# Patient Record
Sex: Female | Born: 1960 | Race: White | Hispanic: No | Marital: Married | Smoking: Former smoker
Health system: Southern US, Community
[De-identification: ages and names within clinical notes are randomized; demographics above are authoritative.]

## PROBLEM LIST (undated history)

## (undated) DIAGNOSIS — I219 Acute myocardial infarction, unspecified: Secondary | ICD-10-CM

## (undated) DIAGNOSIS — E119 Type 2 diabetes mellitus without complications: Secondary | ICD-10-CM

## (undated) DIAGNOSIS — K219 Gastro-esophageal reflux disease without esophagitis: Secondary | ICD-10-CM

## (undated) DIAGNOSIS — B029 Zoster without complications: Secondary | ICD-10-CM

## (undated) HISTORY — PX: ABDOMINAL HYSTERECTOMY: SHX81

## (undated) HISTORY — PX: OTHER SURGICAL HISTORY: SHX169

## (undated) HISTORY — PX: TUBAL LIGATION: SHX77

---
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
## 2014-03-24 DIAGNOSIS — E119 Type 2 diabetes mellitus without complications: Secondary | ICD-10-CM | POA: Insufficient documentation

## 2014-03-24 DIAGNOSIS — I10 Essential (primary) hypertension: Secondary | ICD-10-CM | POA: Insufficient documentation

## 2014-03-28 DIAGNOSIS — I2510 Atherosclerotic heart disease of native coronary artery without angina pectoris: Secondary | ICD-10-CM | POA: Insufficient documentation

## 2014-03-28 DIAGNOSIS — Z955 Presence of coronary angioplasty implant and graft: Secondary | ICD-10-CM | POA: Insufficient documentation

## 2014-06-10 DIAGNOSIS — I252 Old myocardial infarction: Secondary | ICD-10-CM | POA: Insufficient documentation

## 2014-06-10 DIAGNOSIS — K219 Gastro-esophageal reflux disease without esophagitis: Secondary | ICD-10-CM | POA: Insufficient documentation

## 2015-07-14 DIAGNOSIS — M720 Palmar fascial fibromatosis [Dupuytren]: Secondary | ICD-10-CM | POA: Insufficient documentation

## 2016-10-24 DIAGNOSIS — IMO0002 Reserved for concepts with insufficient information to code with codable children: Secondary | ICD-10-CM | POA: Insufficient documentation

## 2016-10-24 DIAGNOSIS — M720 Palmar fascial fibromatosis [Dupuytren]: Secondary | ICD-10-CM | POA: Insufficient documentation

## 2016-10-24 DIAGNOSIS — G5603 Carpal tunnel syndrome, bilateral upper limbs: Secondary | ICD-10-CM | POA: Insufficient documentation

## 2019-03-10 ENCOUNTER — Ambulatory Visit
Admission: EM | Admit: 2019-03-10 | Discharge: 2019-03-10 | Disposition: A | Discharge status: Home / Self Care | Payer: BC Managed Care – PPO | Attending: Family Medicine | Admitting: Family Medicine

## 2019-03-10 ENCOUNTER — Other Ambulatory Visit: Payer: Self-pay

## 2019-03-10 DIAGNOSIS — B029 Zoster without complications: Secondary | ICD-10-CM

## 2019-03-10 HISTORY — DX: Acute myocardial infarction, unspecified: I21.9

## 2019-03-10 HISTORY — DX: Type 2 diabetes mellitus without complications: E11.9

## 2019-03-10 HISTORY — DX: Gastro-esophageal reflux disease without esophagitis: K21.9

## 2019-03-10 HISTORY — DX: Zoster without complications: B02.9

## 2019-03-10 MED ORDER — NAPROXEN 500 MG PO TABS: 500.0000 mg | ORAL_TABLET | Freq: Two times a day (BID) | ORAL | 0 refills | Status: AC | PRN

## 2019-03-10 MED ORDER — VALACYCLOVIR HCL 1 G PO TABS: 1000.0000 mg | ORAL_TABLET | Freq: Three times a day (TID) | ORAL | 0 refills | Status: DC

## 2019-03-10 NOTE — ED Triage Notes (Signed)
Pt with shingles-type rash to upper right back started on Tuesday. Pain 3/10

## 2019-03-10 NOTE — ED Provider Notes (Signed)
[facility]    CSN: [id number] Arrival date & time: 03/10/19  1242  History   Chief Complaint Chief Complaint  Patient presents with  . Rash   HPI  58 year old female presents with rash.  Patient reports that her rash started on Tuesday.  Is located on her right upper back above the shoulder blade.  She reports preceding pain which she describes as tingling and burning.  Pain is moderate to severe currently.  She states that she is been under a great deal of stress recently.  No fever.  No known relieving factors. No other associated symptoms. No other complaints.  PMH, Surgical Hx, Family Hx, Social History reviewed and updated as below.  Past Medical History:  Diagnosis Date  . Acid reflux   . Diabetes ([place])   . MI (myocardial infarction) ([place])   Insomnia CAD  Past Surgical History:  Procedure Laterality Date  . ABDOMINAL HYSTERECTOMY    . cardiac stents    . TUBAL LIGATION     OB History   No obstetric history on file.    Home Medications    Prior to Admission medications   Medication Sig Start Date End Date Taking? Authorizing Provider  aspirin EC 81 MG tablet Take 81 mg by mouth daily.   Yes [provider]  insulin glargine (LANTUS) 100 UNIT/ML injection Inject into the skin daily.   Yes [provider]  metFORMIN (GLUCOPHAGE) 1000 MG tablet Take 1,000 mg by mouth 2 (two) times daily with a meal.   Yes [provider]  amitriptyline (ELAVIL) 25 MG tablet TAKE 1 TABLET BY MOUTH EVERY DAY EVERY NIGHT 01/28/19   [provider]  carvedilol (COREG) 6.25 MG tablet  02/06/19   [provider]  clindamycin (CLEOCIN T) 1 % external solution APPLY TO AFFECTED AREA TWICE A DAY 02/01/19   [provider]  naproxen (NAPROSYN) 500 MG tablet Take 1 tablet (500 mg total) by mouth 2 (two) times daily as needed for moderate pain. 03/10/19   Coral Spikes, DO  pantoprazole (PROTONIX) 40 MG tablet Take 40 mg by mouth  daily. 01/11/19   [provider]  traZODone (DESYREL) 150 MG tablet TAKE 1 TABLET (150 MG TOTAL) BY MOUTH NIGHTLY AS NEEDED FOR SLEEP. 02/07/19   [provider]  valACYclovir (VALTREX) 1000 MG tablet Take 1 tablet (1,000 mg total) by mouth 3 (three) times daily. 03/10/19   Coral Spikes, DO   Social History Social History   Tobacco Use  . Smoking status: Former Research scientist (life sciences)  . Smokeless tobacco: Never Used  Substance Use Topics  . Alcohol use: Yes    Comment: social  . Drug use: Never   Allergies   Patient has no known allergies.   Review of Systems Review of Systems  Constitutional: Negative.   Skin: Positive for rash.   Physical Exam Triage Vital Signs ED Triage Vitals  Enc Vitals Group     BP 03/10/19 1253 (!) 159/90     Pulse Rate 03/10/19 1253 92     Resp 03/10/19 1253 18     Temp 03/10/19 1253 98.3 F (36.8 C)     Temp Source 03/10/19 1253 Oral     SpO2 03/10/19 1253 99 %     Weight 03/10/19 1255 180 lb (81.6 kg)     Height 03/10/19 1255 5\' 5"  (1.651 m)     Head Circumference --      Peak Flow --  Pain Score 03/10/19 1255 3     Pain Loc --      Pain Edu? --      Excl. in GC? --    Updated Vital Signs BP (!) 159/90 (BP Location: Left Arm)   Pulse 92   Temp 98.3 F (36.8 C) (Oral)   Resp 18   Ht 5\' 5"  (1.651 m)   Wt 81.6 kg   SpO2 99%   BMI 29.95 kg/m   Visual Acuity Right Eye Distance:   Left Eye Distance:   Bilateral Distance:    Right Eye Near:   Left Eye Near:    Bilateral Near:     Physical Exam Vitals signs and nursing note reviewed.  Constitutional:      General: She is not in acute distress.    Appearance: Normal appearance.  HENT:     Head: Normocephalic.  Eyes:     General:        Right eye: No discharge.        Left eye: No discharge.     Conjunctiva/sclera: Conjunctivae normal.  Cardiovascular:     Rate and Rhythm: Normal rate and regular rhythm.  Pulmonary:     Effort: Pulmonary effort is normal.      Breath sounds: Normal breath sounds.  Skin:         Comments: Raised, vesicular rash noted at the labelled location.  Patient does have a few pustules.  Neurological:     Mental Status: She is alert.  Psychiatric:        Mood and Affect: Mood normal.        Behavior: Behavior normal.    UC Treatments / Results  Labs (all labs ordered are listed, but only abnormal results are displayed) Labs Reviewed - No data to display  EKG   Radiology No results found.  Procedures Procedures (including critical care time)  Medications Ordered in UC Medications - No data to display  Initial Impression / Assessment and Plan / UC Course  I have reviewed the triage vital signs and the nursing notes.  Pertinent labs & imaging results that were available during my care of the patient were reviewed by me and considered in my medical decision making (see chart for details).    58 year old female presents with suspected herpes zoster.  Treated with Valtrex.  Naproxen as needed for pain.  Supportive care.  Final Clinical Impressions(s) / UC Diagnoses   Final diagnoses:  Herpes zoster without complication   Discharge Instructions   None    ED Prescriptions    Medication Sig Dispense Auth. Provider   valACYclovir (VALTREX) 1000 MG tablet Take 1 tablet (1,000 mg total) by mouth 3 (three) times daily. 21 tablet Ermon Sagan G, DO   naproxen (NAPROSYN) 500 MG tablet Take 1 tablet (500 mg total) by mouth 2 (two) times daily as needed for moderate pain. 30 tablet Tommie Samsook, Earlyne Feeser G, DO     Controlled Substance Prescriptions [place] Controlled Substance Registry consulted? Not Applicable   Tommie SamsCook, Espyn Radwan G, DO 03/10/19 1511

## 2019-03-19 ENCOUNTER — Ambulatory Visit
Admission: EM | Admit: 2019-03-19 | Discharge: 2019-03-19 | Disposition: A | Discharge status: Home / Self Care | Payer: BC Managed Care – PPO | Attending: Urgent Care | Admitting: Urgent Care

## 2019-03-19 ENCOUNTER — Encounter: Payer: Self-pay | Admitting: Emergency Medicine

## 2019-03-19 ENCOUNTER — Other Ambulatory Visit: Payer: Self-pay

## 2019-03-19 DIAGNOSIS — L03312 Cellulitis of back [any part except buttock]: Secondary | ICD-10-CM | POA: Diagnosis not present

## 2019-03-19 DIAGNOSIS — B028 Zoster with other complications: Secondary | ICD-10-CM

## 2019-03-19 MED ORDER — GABAPENTIN 100 MG PO CAPS: 100.0000 mg | ORAL_CAPSULE | Freq: Every evening | ORAL | 0 refills | Status: DC | PRN

## 2019-03-19 MED ORDER — SULFAMETHOXAZOLE-TRIMETHOPRIM 800-160 MG PO TABS: 1.0000 | ORAL_TABLET | Freq: Two times a day (BID) | ORAL | 0 refills | Status: AC

## 2019-03-19 MED ORDER — HYDROCODONE-ACETAMINOPHEN 5-325 MG PO TABS: 1.0000 | ORAL_TABLET | Freq: Two times a day (BID) | ORAL | 0 refills | Status: DC | PRN

## 2019-03-19 MED ORDER — VALACYCLOVIR HCL 1 G PO TABS: 1000.0000 mg | ORAL_TABLET | Freq: Three times a day (TID) | ORAL | 0 refills | Status: AC

## 2019-03-19 NOTE — ED Triage Notes (Signed)
Was seen here on 7/19 and dx with shingles, given valtrex and naproxen. Pt reports rash has gotten worse, more oozing and pain since then. Pt reports she is diabetic.

## 2019-03-19 NOTE — Discharge Instructions (Signed)
It was very nice seeing you today in clinic. Thank you for entrusting me with your care.   Please utilize the medications that we discussed. Your prescriptions have been called in to your pharmacy. Do not drive when using the opioids.   Keep areas clean and dry. Leave open to air while at home to promote drying.   Make arrangements to follow up with your regular doctor in 3 days for re-evaluation if not improving. If your symptoms/condition worsens, please seek follow up care either here or in the ER. Please remember, our [facility] providers are "right here with you" when you need [place].   Again, it was my pleasure to take care of you today. Thank you for choosing our clinic. I hope that you start to feel better quickly.   Honor Loh, MSN, APRN, FNP-C, CEN Advanced Practice Provider [facility]

## 2019-03-19 NOTE — ED Provider Notes (Signed)
[place], [place]   Name: Nicole Singh DOB: 04/07/1961 MRN: [id number] CSN: [id number] PCP: System, Pcp Not In  Arrival date and time:  03/19/19 1108  Chief Complaint:  Rash   NOTE: Prior to seeing the patient today, I have reviewed the triage nursing documentation and vital signs. Clinical staff has updated patient's PMH/PSHx, current medication list, and drug allergies/intolerances to ensure comprehensive history available to assist in medical decision making.   History:   HPI: Nicole Singh is a 58 y.o. female who presents today with complaints of pain to the RIGHT side of her upper back (superior to scapula). Pain is described as a constant and intense burning. She was seen here on 03/10/2019 for the same. She was diagnosed with shingles and prescribed a 7 day course of valacyclovir and naprosyn for pain. Patient presents today having completed her antiviral therapy on 03/17/2019. Over the course of the last few days, patient notes that area has been draining copious amounts of sanguinopurulent exudate. She has gone back to work at this point. Due to the drainage, patient is having to apply cover dressings to prevent the drainage from seeping through her clothing. Patient reports that she is super saturating at least 3 bulky dressings every day. She has not experienced any associated fevers.Her pain has intensified despite the naprosyn. Her sleep quality has been poor overall due to her pain. Patient presents to clinic today in tears; self rates pain 7/10.   Past Medical History:  Diagnosis Date  . Acid reflux   . Diabetes ([facility])   . MI (myocardial infarction) ([facility])   . Shingles 03/10/2019    Past Surgical History:  Procedure Laterality Date  . ABDOMINAL HYSTERECTOMY    . cardiac stents    . TUBAL LIGATION      History reviewed. No pertinent family history.  Social History   Tobacco Use  . Smoking status: Former Games developermoker  . Smokeless tobacco: Never Used  Substance Use Topics   . Alcohol use: Yes    Comment: social  . Drug use: Never    There are no active problems to display for this patient.   Home Medications:    No outpatient medications have been marked as taking for the 03/19/19 encounter [facility](Hospital Encounter).    Allergies:   Patient has no known allergies.  Review of Systems (ROS): Review of Systems  Constitutional: Negative for chills and fever.  Respiratory: Negative for cough and shortness of breath.   Cardiovascular: Negative for chest pain and palpitations.  Gastrointestinal: Negative for diarrhea, nausea and vomiting.  Musculoskeletal: Negative for arthralgias, back pain, myalgias and neck pain.  Skin: Positive for color change, rash and wound.  Neurological: Negative for dizziness, seizures, syncope, weakness, numbness and headaches.  Hematological: Negative for adenopathy.  Psychiatric/Behavioral: Positive for sleep disturbance. The patient is nervous/anxious.   All other systems reviewed and are negative.    Vital Signs: Today's Vitals   03/19/19 1121 03/19/19 1122  BP:  131/76  Pulse: (!) 108   Resp: 20   Temp: 98.9 F (37.2 C)   TempSrc: Oral   SpO2: 98%   Weight:  178 lb (80.7 kg)  Height:  5\' 5"  (1.651 m)  PainSc:  7     Physical Exam: Constitutional: She is oriented to person, place, and time and well-developed, well-nourished, and in no distress.  HENT:  Head: Normocephalic and atraumatic.  Mouth/Throat: Mucous membranes are normal.  Eyes: Pupils are equal, round, and reactive to light. EOM are  normal.  Neck: Normal range of motion. Neck supple. No tracheal deviation present.  Cardiovascular: Normal rate, regular rhythm, normal heart sounds and intact distal pulses. Exam reveals no gallop and no friction rub.  No murmur heard. Pulmonary/Chest: Effort normal and breath sounds normal. No respiratory distress. She has no wheezes. She has no rales.  Lymphadenopathy:    She has no cervical adenopathy.  Neurological:  She is alert and oriented to person, place, and time. Gait normal. GCS score is 15.  Skin: Skin is warm and dry. Rash noted. Rash is vesicular.     Psychiatric: Memory, affect and judgment normal. Her mood appears anxious (tearful).  Nursing note and vitals reviewed.  Urgent Care Treatments / Results:   LABS: PLEASE NOTE: all labs that were ordered this encounter are listed, however only abnormal results are displayed. Labs Reviewed - No data to display  EKG: -None  RADIOLOGY: No results found.  PROCEDURES: Procedures  MEDICATIONS RECEIVED THIS VISIT: Medications - No data to display  PERTINENT CLINICAL COURSE NOTES/UPDATES:   Initial Impression / Assessment and Plan / Urgent Care Course:  Pertinent labs & imaging results that were available during my care of the patient were personally reviewed by me and considered in my medical decision making (see lab/imaging section of note for values and interpretations).  Nicole Singh is a 58 y.o. female who [facility] with complaints of worsening shingles and associated skin infection.   Patient is well appearing overall in clinic today. She does not appear to be in any acute distress. Presenting symptoms (see HPI) and exam as documented above. Area of concern consistent with persistent shingles infection with bacterial supra-infection. Draining rash overlying tender areas of induration that is warm and exquisitely tender to touch. Discussed that normal skin flora/bacteria was likely introduced into the skin when the rash initially declared and was pruritic. Will repeat valacyclovir course and add in a 7 day course of SMZ-TMP DS for supra-infection (cellulitis). Pain is severe. Will add gabapentin 100 mg qhs PRN; will likely require dose titration. Additionally, I wanted to use Tramadol during the day for severe pain, however she is on amitriptyline and trazodone, both of which are associated with serotonin syndrome  when used in combination with tramadol. Will provide a short course of Norco 5/325 mg for severe pain not relieved by the other interventions in place. She was educated on the side effects of these medications and encouraged not to take them all together; stagger doses.   We discussed wound care in clinic today.  At present, patient's husband is performed care. He is taping over active/healing lesions, which effectively opens the areas when tape is removed. I had clinic RN demonstrate proper care to patient. We discussed leaving open to air as much as possible while at home to allow wound to dry out and promote crusting/healing. Patient encouraged to keep area as clean and dry. She was reminded that while areas are open and draining, she is considered contagious.   Current clinical condition warrants patient being out of work in order to recover from her current injury/illness. She was provided with the appropriate documentation to provide to her place of employment that will allow for her to RTW on 03/22/2019 with no restrictions.   Discussed follow up with primary care physician in 3 days for re-evaluation if not starting to see some improvement. I have reviewed the follow up and strict return precautions for any new or worsening symptoms. Patient is  aware of symptoms that would be deemed urgent/emergent, and would thus require further evaluation either here or in the emergency department. At the time of discharge, she verbalized understanding and consent with the discharge plan as it was reviewed with her. All questions were fielded by provider and/or clinic staff prior to patient discharge.    Final Clinical Impressions / Urgent Care Diagnoses:   Final diagnoses:  Herpes zoster with other complication  Cellulitis of back except buttock    New Prescriptions:  [place] Controlled Substance Registry consulted? Yes, I have consulted the [place] Controlled Substances Registry for this patient, and feel the  risk/benefit ratio today is favorable for proceeding with this prescription for a controlled substance.  Meds ordered this encounter  Medications  . valACYclovir (VALTREX) 1000 MG tablet    Sig: Take 1 tablet (1,000 mg total) by mouth 3 (three) times daily.    Dispense:  21 tablet    Refill:  0  . sulfamethoxazole-trimethoprim (BACTRIM DS) 800-160 MG tablet    Sig: Take 1 tablet by mouth 2 (two) times daily for 7 days.    Dispense:  14 tablet    Refill:  0  . gabapentin (NEURONTIN) 100 MG capsule    Sig: Take 1 capsule (100 mg total) by mouth at bedtime as needed.    Dispense:  30 capsule    Refill:  0  . HYDROcodone-acetaminophen (NORCO/VICODIN) 5-325 MG tablet    Sig: Take 1 tablet by mouth 2 (two) times daily as needed.    Dispense:  10 tablet    Refill:  0   . Discussed use of controlled substance medication to treat her acute pain.  o Reviewed [place] STOP Act regulations  o Clinic does not refill controlled substances over the phone without face to face evaluation.  . Safety precautions reviewed.  o Medications should not be bitten, chewed, crushed, shared, or taken with alcohol.  o Avoid use while working, driving, or operating heavy machinery.  o Side effects associated with the use of this particular medication reviewed. - Patient understands that this medication can cause CNS depression, increase her risk of falls, and even lead to overdose that may result in death, if used outside of the parameters that she and I discussed.  With all of this in mind, she knowingly accepts the risks and responsibilities associated with intended course of treatment, and elects to responsibly proceed as discussed.  Recommended Follow up Care:  Patient encouraged to follow up with the following provider within the specified time frame, or sooner as dictated by the severity of her symptoms. As always, she was instructed that for any urgent/emergent care needs, she should seek care either here or in  the emergency department for more immediate evaluation.  Follow-up Information    PCP In 3 days.         NOTE: This note was prepared using Lobbyist along with smaller Company secretary. Despite my best ability to proofread, there is the potential that transcriptional errors may still occur from this process, and are completely unintentional.      Karen Kitchens, NP 03/19/19 2112

## 2019-03-21 ENCOUNTER — Telehealth: Payer: Self-pay | Admitting: Emergency Medicine

## 2019-03-21 NOTE — Telephone Encounter (Signed)
Patient called stating the Gabapentin was not working for her pain. She is requesting another script for Hydrocodone. Per directions patient should still have this medication. Spoke with Honor Loh, NP and he stated to have patient increase Gabapentin to 2 capsules at bedtime and that she needed to follow up with her PCP for further pain medication. Patient verbalized understanding.

## 2019-07-31 ENCOUNTER — Other Ambulatory Visit: Payer: Self-pay | Admitting: Obstetrics and Gynecology

## 2019-07-31 DIAGNOSIS — Z1231 Encounter for screening mammogram for malignant neoplasm of breast: Secondary | ICD-10-CM

## 2019-08-21 DIAGNOSIS — U071 COVID-19: Secondary | ICD-10-CM

## 2019-08-21 HISTORY — DX: COVID-19: U07.1

## 2019-08-26 ENCOUNTER — Other Ambulatory Visit: Payer: Self-pay

## 2019-08-26 ENCOUNTER — Ambulatory Visit (INDEPENDENT_AMBULATORY_CARE_PROVIDER_SITE_OTHER): Payer: BC Managed Care – PPO

## 2019-08-26 ENCOUNTER — Ambulatory Visit
Admission: EM | Admit: 2019-08-26 | Discharge: 2019-08-26 | Disposition: A | Discharge status: Home / Self Care | Payer: BC Managed Care – PPO | Attending: Nurse Practitioner | Admitting: Nurse Practitioner

## 2019-08-26 DIAGNOSIS — R05 Cough: Secondary | ICD-10-CM | POA: Diagnosis not present

## 2019-08-26 DIAGNOSIS — R059 Cough, unspecified: Secondary | ICD-10-CM

## 2019-08-26 DIAGNOSIS — U071 COVID-19: Secondary | ICD-10-CM

## 2019-08-26 MED ORDER — METHYLPREDNISOLONE SODIUM SUCC 40 MG IJ SOLR
80.0000 mg | Freq: Once | INTRAMUSCULAR | Status: AC
  Administered 2019-08-26: 17:00:00 80 mg via INTRAMUSCULAR

## 2019-08-26 MED ORDER — AZITHROMYCIN 250 MG PO TABS: 250.0000 mg | ORAL_TABLET | Freq: Every day | ORAL | 0 refills | Status: DC

## 2019-08-26 MED ORDER — DM-GUAIFENESIN ER 30-600 MG PO TB12: 1.0000 | ORAL_TABLET | Freq: Two times a day (BID) | ORAL | 0 refills | Status: AC

## 2019-08-26 MED ORDER — ALBUTEROL SULFATE HFA 108 (90 BASE) MCG/ACT IN AERS: 1.0000 | INHALATION_SPRAY | Freq: Four times a day (QID) | RESPIRATORY_TRACT | 0 refills | Status: AC | PRN

## 2019-08-26 MED ORDER — PROMETHAZINE-DM 6.25-15 MG/5ML PO SYRP: 5.0000 mL | ORAL_SOLUTION | Freq: Four times a day (QID) | ORAL | 0 refills | Status: DC | PRN

## 2019-08-26 NOTE — ED Triage Notes (Addendum)
Pt. States the [facility] Informed her to come here for a chest Xray to make sure she does NOT have puemonia and COVID. She tested POSITIVE on 08/21/2019, states it hurts when coughing.

## 2019-08-26 NOTE — Discharge Instructions (Signed)
Take medications as prescribed. You may take tylenol or ibuprofen as needed for fevers/headache/body aches. Drink plenty of fluids. Stay in home isolation until you have met all 3 criteria needed to discontinue isolation. Please follow CDC guidelines that are attached. You may discontinue home isolation when there has been at least 10 days since symptoms onset AND 3 days fever free without antipyretics (Tylenol or Ibuprofen) AND an overall improvement in your symptoms. Go to the ED immediately if you get worse or have any other symptoms.   Feel better soon!  Aldona Bar, FNP-C

## 2019-08-26 NOTE — ED Provider Notes (Signed)
[facility]    CSN: [id number] Arrival date & time: 08/26/19  1538      History   Chief Complaint Chief Complaint  Patient presents with   Chest XRay    HPI Nikoleta Dady is a 59 y.o. female.   Subjective:   Sundi Slevin is a 59 y.o. female here for evaluation of a cough. The cough is harsh, barking and worsening over time. It is aggravated by nothing. Onset of symptoms was several days ago and gradually worsening since that time. Notably, the patient was diagnosed with COVID-19 on 08/21/19. She has had fevers, chills, body aches, sore throat, shortness of breath, nausea, diarrhea, headache, dizziness and change in taste/smell.  Denies any vomiting. Patient does not have a history of asthma. Patient has not had recent travel. Patient does not have a history of smoking.  The following portions of the patient's history were reviewed and updated as appropriate: allergies, current medications, past family history, past medical history, past social history, past surgical history and problem list.       Past Medical History:  Diagnosis Date   Acid reflux    Diabetes ([place])    MI (myocardial infarction) ([place])    Shingles 03/10/2019    There are no problems to display for this patient.   Past Surgical History:  Procedure Laterality Date   ABDOMINAL HYSTERECTOMY     cardiac stents     TUBAL LIGATION      OB History   No obstetric history on file.      Home Medications    Prior to Admission medications   Medication Sig Start Date End Date Taking? Authorizing Provider  albuterol (VENTOLIN HFA) 108 (90 Base) MCG/ACT inhaler Inhale 1-2 puffs into the lungs every 6 (six) hours as needed for wheezing or shortness of breath. 08/26/19   Enrique Sack, FNP  amitriptyline (ELAVIL) 25 MG tablet TAKE 1 TABLET BY MOUTH EVERY DAY EVERY NIGHT 01/28/19   [provider]  aspirin EC 81 MG tablet Take 81 mg by mouth daily.    [provider]    azithromycin (ZITHROMAX) 250 MG tablet Take 1 tablet (250 mg total) by mouth daily. Take first 2 tablets together, then 1 every day until finished. 08/26/19   Enrique Sack, FNP  carvedilol (COREG) 6.25 MG tablet  02/06/19   [provider]  clindamycin (CLEOCIN T) 1 % external solution APPLY TO AFFECTED AREA TWICE A DAY 02/01/19   [provider]  dextromethorphan-guaiFENesin (MUCINEX DM) 30-600 MG 12hr tablet Take 1 tablet by mouth 2 (two) times daily for 7 days. 08/26/19 09/02/19  Enrique Sack, FNP  gabapentin (NEURONTIN) 100 MG capsule Take 1 capsule (100 mg total) by mouth at bedtime as needed. 03/19/19   Karen Kitchens, NP  HYDROcodone-acetaminophen (NORCO/VICODIN) 5-325 MG tablet Take 1 tablet by mouth 2 (two) times daily as needed. 03/19/19   Karen Kitchens, NP  insulin glargine (LANTUS) 100 UNIT/ML injection Inject into the skin daily.    [provider]  metFORMIN (GLUCOPHAGE) 1000 MG tablet Take 1,000 mg by mouth 2 (two) times daily with a meal.    [provider]  naproxen (NAPROSYN) 500 MG tablet Take 1 tablet (500 mg total) by mouth 2 (two) times daily as needed for moderate pain. 03/10/19   Coral Spikes, DO  pantoprazole (PROTONIX) 40 MG tablet Take 40 mg by mouth daily. 01/11/19   [provider]  promethazine-dextromethorphan (PROMETHAZINE-DM) 6.25-15 MG/5ML syrup Take 5  mLs by mouth 4 (four) times daily as needed for cough. 08/26/19   Enrique Sack, FNP  traZODone (DESYREL) 150 MG tablet TAKE 1 TABLET (150 MG TOTAL) BY MOUTH NIGHTLY AS NEEDED FOR SLEEP. 02/07/19   [provider]  valACYclovir (VALTREX) 1000 MG tablet Take 1 tablet (1,000 mg total) by mouth 3 (three) times daily. 03/19/19   Karen Kitchens, NP    Family History Family History  Problem Relation Age of Onset   Emphysema Mother    Diabetes Father     Social History Social History   Tobacco Use   Smoking status: Former Smoker   Smokeless tobacco: Never  Used  Substance Use Topics   Alcohol use: Yes    Comment: social   Drug use: Never     Allergies   Patient has no known allergies.   Review of Systems Review of Systems  Constitutional: Positive for chills, diaphoresis, fatigue and fever.  HENT: Positive for congestion and sore throat.   Respiratory: Positive for cough, shortness of breath and wheezing.   Cardiovascular: Negative for chest pain.  Gastrointestinal: Positive for diarrhea and nausea. Negative for vomiting.  Musculoskeletal: Positive for myalgias.  Skin: Negative for rash.  Neurological: Positive for dizziness and headaches.  All other systems reviewed and are negative.    Physical Exam Triage Vital Signs ED Triage Vitals  Enc Vitals Group     BP 08/26/19 1550 (!) 131/55     Pulse Rate 08/26/19 1550 100     Resp 08/26/19 1550 18     Temp 08/26/19 1550 97.6 F (36.4 C)     Temp Source 08/26/19 1550 Oral     SpO2 08/26/19 1550 96 %     Weight 08/26/19 1547 180 lb (81.6 kg)     Height --      Head Circumference --      Peak Flow --      Pain Score 08/26/19 1546 0     Pain Loc --      Pain Edu? --      Excl. in [place]? --    No data found.  Updated Vital Signs BP (!) 131/55 (BP Location: Right Arm)    Pulse 100    Temp 97.6 F (36.4 C) (Oral)    Resp 18    Wt 180 lb (81.6 kg)    SpO2 96%    BMI 29.95 kg/m   Visual Acuity Right Eye Distance:   Left Eye Distance:   Bilateral Distance:    Right Eye Near:   Left Eye Near:    Bilateral Near:     Physical Exam Vitals reviewed.  Constitutional:      General: She is not in acute distress.    Appearance: Normal appearance. She is ill-appearing. She is not toxic-appearing or diaphoretic.  HENT:     Head: Normocephalic.  Cardiovascular:     Rate and Rhythm: Normal rate and regular rhythm.  Pulmonary:     Effort: Pulmonary effort is normal.     Breath sounds: Normal breath sounds.     Comments: Congested cough noted  Musculoskeletal:         General: Normal range of motion.     Cervical back: Normal range of motion and neck supple.  Lymphadenopathy:     Cervical: No cervical adenopathy.  Skin:    General: Skin is warm and dry.  Neurological:     General: No focal deficit present.     Mental Status:  She is alert and oriented to person, place, and time.  Psychiatric:        Mood and Affect: Mood normal.      UC Treatments / Results  Labs (all labs ordered are listed, but only abnormal results are displayed) Labs Reviewed - No data to display  EKG   Radiology DG Chest 2 View  Result Date: 08/26/2019 CLINICAL DATA:  Cough.  COVID-19 positive EXAM: CHEST - 2 VIEW COMPARISON:  None. FINDINGS: The heart size and mediastinal contours are within normal limits. Both lungs are clear. The visualized skeletal structures are unremarkable. IMPRESSION: No active cardiopulmonary disease. Electronically Signed   By: Franchot Gallo M.D.   On: 08/26/2019 16:31    Procedures Procedures (including critical care time)  Medications Ordered in UC Medications  methylPREDNISolone sodium succinate (SOLU-MEDROL) 40 mg/mL injection 80 mg (has no administration in time range)    Initial Impression / Assessment and Plan / UC Course  I have reviewed the triage vital signs and the nursing notes.  Pertinent labs & imaging results that were available during my care of the patient were reviewed by me and considered in my medical decision making (see chart for details).    59 year old female with a recent history of COVID-19 presenting with worsening cough.  Patient is afebrile.  Acutely ill but nontoxic-appearing.  Vital signs stable.  Oxygen saturations 96% on room air.  No acute distress.  Chest x-ray negative for any active cardiopulmonary disease. Antibiotics and antitussives per medication orders. Albuterol MDI PRN.  Solu-Medrol 80 mg IM given in clinic.  Patient has history of insulin-dependent diabetes.  Advised to monitor blood sugars  closely.  Continue supportive care measures and isolation per CDC guidelines. Go to the ED immediately if symptoms get worse. Patient agreeable.   Today's evaluation has revealed no signs of a dangerous process. Discussed diagnosis with patient and/or guardian. Patient and/or guardian aware of their diagnosis, possible red flag symptoms to watch out for and need for close follow up. Patient and/or guardian understands verbal and written discharge instructions. Patient and/or guardian comfortable with plan and disposition.  Patient and/or guardian has a clear mental status at this time, good insight into illness (after discussion and teaching) and has clear judgment to make decisions regarding their care  This care was provided during an unprecedented National Emergency due to the Novel Coronavirus (COVID-19) pandemic. COVID-19 infections and transmission risks place heavy strains on healthcare resources.  As this pandemic evolves, our facility, providers, and staff strive to respond fluidly, to remain operational, and to provide care relative to available resources and information. Outcomes are unpredictable and treatments are without well-defined guidelines. Further, the impact of COVID-19 on all aspects of urgent care, including the impact to patients seeking care for reasons other than COVID-19, is unavoidable during this national emergency. At this time of the global pandemic, management of patients has significantly changed, even for non-COVID positive patients given high local and regional COVID volumes at this time requiring high healthcare system and resource utilization. The standard of care for management of both COVID suspected and non-COVID suspected patients continues to change rapidly at the local, regional, national, and global levels. This patient was worked up and treated to the best available but ever changing evidence and resources available at this current time.   Documentation was  completed with the aid of voice recognition software. Transcription may contain typographical errors.   Final Clinical Impressions(s) / UC Diagnoses   Final diagnoses:  COVID-19  Cough     Discharge Instructions     Take medications as prescribed. You may take tylenol or ibuprofen as needed for fevers/headache/body aches. Drink plenty of fluids. Stay in home isolation until you have met all 3 criteria needed to discontinue isolation. Please follow CDC guidelines that are attached. You may discontinue home isolation when there has been at least 10 days since symptoms onset AND 3 days fever free without antipyretics (Tylenol or Ibuprofen) AND an overall improvement in your symptoms. Go to the ED immediately if you get worse or have any other symptoms.   Feel better soon!  Aldona Bar, FNP-C      ED Prescriptions    Medication Sig Dispense Auth. Provider   azithromycin (ZITHROMAX) 250 MG tablet Take 1 tablet (250 mg total) by mouth daily. Take first 2 tablets together, then 1 every day until finished. 6 tablet Enrique Sack, FNP   promethazine-dextromethorphan (PROMETHAZINE-DM) 6.25-15 MG/5ML syrup Take 5 mLs by mouth 4 (four) times daily as needed for cough. 118 mL Tiernan Millikin, Aldona Bar, FNP   dextromethorphan-guaiFENesin [facility] DM) 30-600 MG 12hr tablet Take 1 tablet by mouth 2 (two) times daily for 7 days. 14 tablet Enrique Sack, FNP   albuterol (VENTOLIN HFA) 108 (90 Base) MCG/ACT inhaler Inhale 1-2 puffs into the lungs every 6 (six) hours as needed for wheezing or shortness of breath. 18 g Enrique Sack, FNP     PDMP not reviewed this encounter.   Enrique Sack, [place] 08/26/19 1655

## 2019-08-30 ENCOUNTER — Emergency Department: Payer: BC Managed Care – PPO

## 2019-08-30 ENCOUNTER — Emergency Department
Admission: EM | Admit: 2019-08-30 | Discharge: 2019-08-31 | Disposition: A | Discharge status: Home / Self Care | Payer: BC Managed Care – PPO | Attending: Emergency Medicine | Admitting: Emergency Medicine

## 2019-08-30 ENCOUNTER — Other Ambulatory Visit: Payer: Self-pay

## 2019-08-30 DIAGNOSIS — Z794 Long term (current) use of insulin: Secondary | ICD-10-CM | POA: Diagnosis not present

## 2019-08-30 DIAGNOSIS — R0602 Shortness of breath: Secondary | ICD-10-CM

## 2019-08-30 DIAGNOSIS — J22 Unspecified acute lower respiratory infection: Secondary | ICD-10-CM | POA: Diagnosis not present

## 2019-08-30 DIAGNOSIS — Z79899 Other long term (current) drug therapy: Secondary | ICD-10-CM | POA: Diagnosis not present

## 2019-08-30 DIAGNOSIS — U071 COVID-19: Secondary | ICD-10-CM | POA: Insufficient documentation

## 2019-08-30 DIAGNOSIS — Z87891 Personal history of nicotine dependence: Secondary | ICD-10-CM | POA: Diagnosis not present

## 2019-08-30 DIAGNOSIS — I252 Old myocardial infarction: Secondary | ICD-10-CM | POA: Insufficient documentation

## 2019-08-30 DIAGNOSIS — Z7982 Long term (current) use of aspirin: Secondary | ICD-10-CM | POA: Insufficient documentation

## 2019-08-30 DIAGNOSIS — E119 Type 2 diabetes mellitus without complications: Secondary | ICD-10-CM | POA: Insufficient documentation

## 2019-08-30 LAB — CBC
HCT: 39.8 % (ref 36.0–46.0)
Hemoglobin: 12.9 g/dL (ref 12.0–15.0)
MCH: 27 pg (ref 26.0–34.0)
MCHC: 32.4 g/dL (ref 30.0–36.0)
MCV: 83.4 fL (ref 80.0–100.0)
Platelets: 375 10*3/uL (ref 150–400)
RBC: 4.77 MIL/uL (ref 3.87–5.11)
RDW: 13.7 % (ref 11.5–15.5)
WBC: 7.3 10*3/uL (ref 4.0–10.5)
nRBC: 0 % (ref 0.0–0.2)

## 2019-08-30 NOTE — ED Triage Notes (Signed)
Pt to the er for SOB, dx with covid on 08/20/20. Went to urgent care on the 4th and given zpack and decadron. Pt completed z pack today. Pt taking tylenol for fever. Pt states she still has fever.

## 2019-08-31 ENCOUNTER — Encounter: Payer: Self-pay | Admitting: Emergency Medicine

## 2019-08-31 LAB — COMPREHENSIVE METABOLIC PANEL
ALT: 14 U/L (ref 0–44)
AST: 17 U/L (ref 15–41)
Albumin: 3.5 g/dL (ref 3.5–5.0)
Alkaline Phosphatase: 58 U/L (ref 38–126)
Anion gap: 11 (ref 5–15)
BUN: 13 mg/dL (ref 6–20)
CO2: 26 mmol/L (ref 22–32)
Calcium: 9 mg/dL (ref 8.9–10.3)
Chloride: 99 mmol/L (ref 98–111)
Creatinine, Ser: 0.74 mg/dL (ref 0.44–1.00)
GFR calc Af Amer: >60 mL/min (ref >60)
GFR calc non Af Amer: >60 mL/min (ref >60)
Glucose, Bld: 276 mg/dL — ABNORMAL HIGH (ref 70–99)
Potassium: 3.7 mmol/L (ref 3.5–5.1)
Sodium: 136 mmol/L (ref 135–145)
Total Bilirubin: 0.8 mg/dL (ref 0.3–1.2)
Total Protein: 7.8 g/dL (ref 6.5–8.1)

## 2019-08-31 LAB — TROPONIN I (HIGH SENSITIVITY): Troponin I (High Sensitivity): 4 ng/L (ref <18)

## 2019-08-31 MED ORDER — DEXAMETHASONE 10 MG/ML FOR PEDIATRIC ORAL USE: 10.0000 mg | Freq: Once | INTRAMUSCULAR | Status: DC

## 2019-08-31 MED ORDER — HYDROCODONE-CHLORPHENIRAMINE 5-4 MG/5ML PO SOLN: 5.0000 mL | Freq: Four times a day (QID) | ORAL | 0 refills | Status: DC | PRN

## 2019-08-31 MED ORDER — BENZONATATE 100 MG PO CAPS: 100.0000 mg | ORAL_CAPSULE | Freq: Three times a day (TID) | ORAL | 0 refills | Status: DC | PRN

## 2019-08-31 MED ORDER — PREDNISONE 10 MG PO TABS: ORAL_TABLET | ORAL | 0 refills | Status: DC

## 2019-08-31 NOTE — Discharge Instructions (Addendum)
As we discussed, although you have tested positive for COVID-19 (coronavirus), you do not need to be hospitalized at this time.  Read through all the included information including the recommendations from the CDC.  We recommend that you self-quarantine at home with your immediate family only (people with whom you have already been in contact) for 10-14 days after your fever has gone away (without taking medication to make your temperature come down, such as Tylenol (acetaminophen)), after your respiratory symptoms have improved, and after at least 14 days have passed since your symptoms first appeared.  You should have as minimal contact as possible with anyone else including close family as per the CDC paperwork guidelines listed below. Follow-up with your doctor by phone or online as needed and return immediately to the emergency department or call 911 only if you develop new or worsening symptoms that concern you.  If you were prescribed any medications, please use them as instructed.  You can find up-to-date information about COVID-19 in [place] by calling the [place] Coronavirus Helpline: 1-866-462-3821. You may also call 2-1-1, or [phone], or additional resources.  You can also find information online at https://www.ncdhhs.gov/divisions/public-health/coronavirus-disease-2019-covid-19-response[place], or on the Center for Disease Control (CDC) website at https://www.cdc.gov/coronavirus/2019-ncov/index.html.  

## 2019-08-31 NOTE — ED Notes (Signed)
Pt ambulated in room. O2 sats remain above 94% on room air.

## 2019-08-31 NOTE — ED Provider Notes (Signed)
[facility] Emergency Department Provider Note  ____________________________________________   First MD Initiated Contact with Patient 08/30/19 2332     (approximate)  I have reviewed the triage vital signs and the nursing notes.   HISTORY  Chief Complaint Covid    HPI Nicole Singh is a 59 y.o. female with medical history as listed below most recently notable for COVID-19 diagnosis on 08/21/2019.  She presents tonight for persistent and likely worsening shortness of breath and cough.  She reports that she is " still having fevers".  She is nauseated but not vomiting.  She has been trying to eat and drink.  She has a sore throat, loss of smell and taste, chest discomfort particularly when coughing, shortness of breath worse with exertion.  No abdominal pain or vomiting.  Nothing in particular makes his symptoms better including the Z-Pak she was prescribed and she got an injection of Decadron at the urgent care about 9 days ago when she was seen originally.        Past Medical History:  Diagnosis Date  . Acid reflux   . COVID-19 08/21/2019   diagnosed in urgent care on 08/21/2019  . Diabetes ([facility])   . MI (myocardial infarction) ([facility])   . Shingles 03/10/2019    There are no problems to display for this patient.   Past Surgical History:  Procedure Laterality Date  . ABDOMINAL HYSTERECTOMY    . cardiac stents    . TUBAL LIGATION      Prior to Admission medications   Medication Sig Start Date End Date Taking? Authorizing Provider  albuterol (VENTOLIN HFA) 108 (90 Base) MCG/ACT inhaler Inhale 1-2 puffs into the lungs every 6 (six) hours as needed for wheezing or shortness of breath. 08/26/19   Lurline Idol, FNP  amitriptyline (ELAVIL) 25 MG tablet TAKE 1 TABLET BY MOUTH EVERY DAY EVERY NIGHT 01/28/19   [provider]  aspirin EC 81 MG tablet Take 81 mg by mouth daily.    [provider]  azithromycin (ZITHROMAX) 250 MG tablet  Take 1 tablet (250 mg total) by mouth daily. Take first 2 tablets together, then 1 every day until finished. 08/26/19   Lurline Idol, FNP  benzonatate (TESSALON PERLES) 100 MG capsule Take 1 capsule (100 mg total) by mouth 3 (three) times daily as needed for cough. 08/31/19   Loleta Rose, MD  carvedilol (COREG) 6.25 MG tablet  02/06/19   [provider]  clindamycin (CLEOCIN T) 1 % external solution APPLY TO AFFECTED AREA TWICE A DAY 02/01/19   [provider]  dextromethorphan-guaiFENesin (MUCINEX DM) 30-600 MG 12hr tablet Take 1 tablet by mouth 2 (two) times daily for 7 days. 08/26/19 09/02/19  Lurline Idol, FNP  gabapentin (NEURONTIN) 100 MG capsule Take 1 capsule (100 mg total) by mouth at bedtime as needed. 03/19/19   Verlee Monte, NP  HYDROcodone-acetaminophen (NORCO/VICODIN) 5-325 MG tablet Take 1 tablet by mouth 2 (two) times daily as needed. 03/19/19   Verlee Monte, NP  HYDROcodone-Chlorpheniramine 5-4 MG/5ML SOLN Take 5 mLs by mouth every 6 (six) hours as needed. 08/31/19   Loleta Rose, MD  insulin glargine (LANTUS) 100 UNIT/ML injection Inject into the skin daily.    [provider]  metFORMIN (GLUCOPHAGE) 1000 MG tablet Take 1,000 mg by mouth 2 (two) times daily with a meal.    [provider]  naproxen (NAPROSYN) 500 MG tablet Take 1 tablet (500 mg total) by mouth 2 (two) times daily  as needed for moderate pain. 03/10/19   Tommie Sams, DO  pantoprazole (PROTONIX) 40 MG tablet Take 40 mg by mouth daily. 01/11/19   [provider]  predniSONE (DELTASONE) 10 MG tablet Take 4 tabs (40 mg) PO x 3 days, then take 2 tabs (20 mg) PO x 3 days, then take 1 tab (10 mg) PO x 3 days, then take 1/2 tab (5 mg) PO x 4 days. 08/31/19   Loleta Rose, MD  promethazine-dextromethorphan (PROMETHAZINE-DM) 6.25-15 MG/5ML syrup Take 5 mLs by mouth 4 (four) times daily as needed for cough. 08/26/19   Lurline Idol, FNP  traZODone (DESYREL) 150 MG tablet TAKE 1  TABLET (150 MG TOTAL) BY MOUTH NIGHTLY AS NEEDED FOR SLEEP. 02/07/19   [provider]  valACYclovir (VALTREX) 1000 MG tablet Take 1 tablet (1,000 mg total) by mouth 3 (three) times daily. 03/19/19   Verlee Monte, NP    Allergies Patient has no known allergies.  Family History  Problem Relation Age of Onset  . Emphysema Mother   . Diabetes Father     Social History Social History   Tobacco Use  . Smoking status: Former Games developer  . Smokeless tobacco: Never Used  Substance Use Topics  . Alcohol use: Yes    Comment: social  . Drug use: Never    Review of Systems Constitutional: No fever/chills Eyes: No visual changes. ENT: Sore throat and loss of smell and taste. Cardiovascular: Chest discomfort when coughing. Respiratory: Shortness of breath and cough. Gastrointestinal: No abdominal pain.  Nausea, no vomiting.  No diarrhea.  No constipation. Genitourinary: Negative for dysuria. Musculoskeletal: Generalized body aches. Integumentary: Negative for rash. Neurological: Negative for headaches, focal weakness or numbness.   ____________________________________________   PHYSICAL EXAM:  VITAL SIGNS: ED Triage Vitals [08/30/19 2209]  Enc Vitals Group     BP (!) 153/77     Pulse Rate (!) 109     Resp (!) 24     Temp 99.5 F (37.5 C)     Temp Source Oral     SpO2 93 %     Weight 79.4 kg (175 lb)     Height 1.651 m (5\' 5" )     Head Circumference      Peak Flow      Pain Score 0     Pain Loc      Pain Edu?      Excl. in [place]?     Constitutional: Alert and oriented.  Appears uncomfortable but not in severe distress. Eyes: Conjunctivae are normal.  Head: Atraumatic. Nose: Mild congestion/rhinnorhea. Mouth/Throat: Patient is wearing a mask. Neck: No stridor.  No meningeal signs.   Cardiovascular: Mild tachycardia, regular rhythm. Good peripheral circulation. Grossly normal heart sounds. Respiratory: Increased respiratory rate with some accessory muscle usage  but good air movement. Gastrointestinal: Soft and nontender. No distention.  Musculoskeletal: No lower extremity tenderness nor edema. No gross deformities of extremities. Neurologic:  Normal speech and language. No gross focal neurologic deficits are appreciated.  Skin:  Skin is warm, dry and intact. Psychiatric: Mood and affect are normal. Speech and behavior are normal.  ____________________________________________   LABS (all labs ordered are listed, but only abnormal results are displayed)  Labs Reviewed  COMPREHENSIVE METABOLIC PANEL - Abnormal; Notable for the following components:      Result Value   Glucose, Bld 276 (*)    All other components within normal limits  CBC  TROPONIN I (HIGH SENSITIVITY)   ____________________________________________  EKG  ED ECG REPORT I, Hinda Kehr, the attending physician, personally viewed and interpreted this ECG.  Date: 08/30/2019 EKG Time: 22: 59 Rate: 55 Rhythm: Sinus bradycardia QRS Axis: normal Intervals: normal ST/T Wave abnormalities: Non-specific ST segment / T-wave changes, but no clear evidence of acute ischemia. Narrative Interpretation: no definitive evidence of acute ischemia; does not meet STEMI criteria.   ____________________________________________  RADIOLOGY I, Hinda Kehr, personally viewed and evaluated these images (plain radiographs) as part of my medical decision making, as well as reviewing the written report by the radiologist.  ED MD interpretation: Viral pneumonia consistent with COVID-19 diagnosis  Official radiology report(s): DG Chest Port 1 View  Result Date: 08/30/2019 CLINICAL DATA:  Shortness of breath, covid positive EXAM: PORTABLE CHEST 1 VIEW COMPARISON:  August 26, 2019 FINDINGS: The heart size and mediastinal contours are within normal limits. Hazy/streaky airspace opacities seen at the right mid and lower lung and left lower lung. No acute osseous abnormality. IMPRESSION: Streaky/hazy  airspace opacity seen within both lower lungs, likely consistent with atypical viral pneumonia. Electronically Signed   By: Prudencio Pair M.D.   On: 08/30/2019 23:36    ____________________________________________   PROCEDURES   Procedure(s) performed (including Critical Care):  Procedures   ____________________________________________   INITIAL IMPRESSION / MDM / [place] / ED COURSE  As part of my medical decision making, I reviewed the following data within the [place] notes reviewed and incorporated, Labs reviewed , EKG interpreted , Old chart reviewed, Radiograph reviewed , Notes from prior ED visits and [place] Controlled Substance Database   Differential diagnosis includes, but is not limited to, COVID-19 pneumonia, superimposed bacterial infection, pulmonary embolism.  Patient has no tachycardia.  She has some increased work of breathing and tachypnea and obviously does not feel well but she is nontoxic in appearance.  Vital signs are stable other than some mild tachycardia and tachypnea.  Lab work is reassuring with a normal comprehensive metabolic panel, a negative high-sensitivity troponin, and a normal CBC with no leukocytosis.  Chest x-ray is consistent with COVID-19 viral pneumonia pattern.  I will have the patient ambulated with a pulse oximeter to see if she drops down into the 80s but currently on room air she is satting 96% when I was in the room.  I had my usual customary COVID-19 discussion with the patient and she understands that if she does not become hypoxemic and have an oxygen requirement she will need to be discharged for outpatient follow-up and continued conservative and symptomatic management.  She understands and agrees with the plan.  In this patient I have a very low suspicion for pulmonary embolism at this time.      Clinical Course as of Aug 30 124  Sat Aug 31, 2019  0117 Patient did not drop below 94% during  ambulation.  Will discharge as planned.   [CF]    Clinical Course User Index [CF] Hinda Kehr, MD     ____________________________________________  FINAL CLINICAL IMPRESSION(S) / ED DIAGNOSES  Final diagnoses:  Lower respiratory tract infection due to COVID-19 virus     MEDICATIONS GIVEN DURING THIS VISIT:  Medications - No data to display   ED Discharge Orders         Ordered    predniSONE (DELTASONE) 10 MG tablet     08/31/19 0124    HYDROcodone-Chlorpheniramine 5-4 MG/5ML SOLN  Every 6 hours PRN     08/31/19 0124    benzonatate (  TESSALON PERLES) 100 MG capsule  3 times daily PRN     08/31/19 0124          *Please note:  Nicole Singh was evaluated in Emergency Department on 08/31/2019 for the symptoms described in the history of present illness. She was evaluated in the context of the global COVID-19 pandemic, which necessitated consideration that the patient might be at risk for infection with the SARS-CoV-2 virus that causes COVID-19. Institutional protocols and algorithms that pertain to the evaluation of patients at risk for COVID-19 are in a state of rapid change based on information released by regulatory bodies including the CDC and federal and state organizations. These policies and algorithms were followed during the patient's care in the ED.  Some ED evaluations and interventions may be delayed as a result of limited staffing during the pandemic.*  Note:  This document was prepared using Dragon voice recognition software and may include unintentional dictation errors.   Loleta Rose, MD 08/31/19 [phone]

## 2019-10-16 ENCOUNTER — Encounter: Payer: Self-pay | Admitting: Emergency Medicine

## 2019-10-16 ENCOUNTER — Other Ambulatory Visit: Payer: Self-pay

## 2019-10-16 ENCOUNTER — Ambulatory Visit
Admission: EM | Admit: 2019-10-16 | Discharge: 2019-10-16 | Disposition: A | Discharge status: Home / Self Care | Payer: BC Managed Care – PPO | Attending: Urgent Care | Admitting: Urgent Care

## 2019-10-16 DIAGNOSIS — M545 Low back pain, unspecified: Secondary | ICD-10-CM

## 2019-10-16 MED ORDER — HYDROCODONE-ACETAMINOPHEN 5-325 MG PO TABS: 1.0000 | ORAL_TABLET | Freq: Three times a day (TID) | ORAL | 0 refills | Status: DC | PRN

## 2019-10-16 MED ORDER — CYCLOBENZAPRINE HCL 10 MG PO TABS: 10.0000 mg | ORAL_TABLET | Freq: Two times a day (BID) | ORAL | 0 refills | Status: AC | PRN

## 2019-10-16 NOTE — Discharge Instructions (Addendum)
It was very nice seeing you today in clinic. Thank you for entrusting me with your care.   As discussed, your pain seems to be musculoskeletal in nature. Plans for treating you are as follows:  Please utilize the medications that we discussed. Your prescriptions have been called in to your pharmacy. Take your Naprosyn and Flexeril FIRST, if not improving, may use Norco.  DO NOT take Flexeril and Norco at the same time as it can make you sleepy.  NO DRIVING while on these medications.   Avoid overdoing it, but you need to make efforts to remain active as tolerated.  Avoiding activity all together can make your pain worse. You may find that alternating between ice and moist heat application will help with your pain.  Heat/ice should be applied for 15-20 minutes at a time at least 3-4 times a day.  Make arrangements to follow up with your regular doctor in 1 week for re-evaluation. If your symptoms/condition worsens, please seek follow up care either here or in the ER. Please remember, our [facility] providers are "right here with you" when you need [place].   Again, it was my pleasure to take care of you today. Thank you for choosing our clinic. I hope that you start to feel better quickly.   Quentin Mulling, MSN, APRN, FNP-C, CEN Advanced Practice Provider [facility]

## 2019-10-16 NOTE — ED Triage Notes (Signed)
Patient c/ low back pain that started Monday. Denies injury.

## 2019-10-17 NOTE — ED Provider Notes (Addendum)
[place]   Name: Nicole Singh DOB: 09/15/1960 MRN: [id number] CSN: [id number] PCP: System, Pcp Not In  Arrival date and time:  10/16/19 0901  Chief Complaint:  Back Pain   NOTE: Prior to seeing the patient today, I have reviewed the triage nursing documentation and vital signs. Clinical staff has updated patient's PMH/PSHx, current medication list, and drug allergies/intolerances to ensure comprehensive history available to assist in medical decision making.   History:   HPI: Nicole Singh is a 59 y.o. female who presents today with complaints of lower back pain that began with acute onset 3 days ago. Patient denies injury. Patient advising that she has similar pain when she had SARS-CoV-2 (novel coronavirus). Pain is BILATERAL. She has not experienced any concurrent urinary symptoms. Patient denies radiation of her pain into her lower extremities. She has not appreciated any weakness or distal paraesthesias in her legs. Patient denies any acute issues with bowel or bladder incontinence. No saddle anesthesia. Patient notes that pain is exacerbated by prolonged sitting/standing, bending, torso twisting, and heavy lifting. She has attempted conservative management at home using naproxen, in addition to applying heate, which has done little to improve her symptoms. PMH does not include any previous back injuries or surgeries.  Past Medical History:  Diagnosis Date  . Acid reflux   . COVID-19 08/21/2019   diagnosed in urgent care on 08/21/2019  . Diabetes (HCC)   . MI (myocardial infarction) (HCC)   . Shingles 03/10/2019    Past Surgical History:  Procedure Laterality Date  . ABDOMINAL HYSTERECTOMY    . cardiac stents    . TUBAL LIGATION      Family History  Problem Relation Age of Onset  . Emphysema Mother   . Diabetes Father     Social History   Tobacco Use  . Smoking status: Former Games developer  . Smokeless tobacco: Never Used  Substance Use Topics  . Alcohol use: Yes      Comment: social  . Drug use: Never    There are no problems to display for this patient.   Home Medications:    Current Meds  Medication Sig  . albuterol (VENTOLIN HFA) 108 (90 Base) MCG/ACT inhaler Inhale 1-2 puffs into the lungs every 6 (six) hours as needed for wheezing or shortness of breath.  Marland Kitchen amitriptyline (ELAVIL) 25 MG tablet TAKE 1 TABLET BY MOUTH EVERY DAY EVERY NIGHT  . aspirin EC 81 MG tablet Take 81 mg by mouth daily.  . carvedilol (COREG) 6.25 MG tablet   . clindamycin (CLEOCIN T) 1 % external solution APPLY TO AFFECTED AREA TWICE A DAY  . gabapentin (NEURONTIN) 100 MG capsule Take 1 capsule (100 mg total) by mouth at bedtime as needed.  . insulin glargine (LANTUS) 100 UNIT/ML injection Inject into the skin daily.  . metFORMIN (GLUCOPHAGE) 1000 MG tablet Take 1,000 mg by mouth 2 (two) times daily with a meal.  . naproxen (NAPROSYN) 500 MG tablet Take 1 tablet (500 mg total) by mouth 2 (two) times daily as needed for moderate pain.  . pantoprazole (PROTONIX) 40 MG tablet Take 40 mg by mouth daily.  . traZODone (DESYREL) 150 MG tablet TAKE 1 TABLET (150 MG TOTAL) BY MOUTH NIGHTLY AS NEEDED FOR SLEEP.  Marland Kitchen valACYclovir (VALTREX) 1000 MG tablet Take 1 tablet (1,000 mg total) by mouth 3 (three) times daily.  . [DISCONTINUED] azithromycin (ZITHROMAX) 250 MG tablet Take 1 tablet (250 mg total) by mouth daily. Take first 2 tablets together, then  1 every day until finished.  . [DISCONTINUED] benzonatate (TESSALON PERLES) 100 MG capsule Take 1 capsule (100 mg total) by mouth 3 (three) times daily as needed for cough.  . [DISCONTINUED] HYDROcodone-acetaminophen (NORCO/VICODIN) 5-325 MG tablet Take 1 tablet by mouth 2 (two) times daily as needed.  . [DISCONTINUED] HYDROcodone-Chlorpheniramine 5-4 MG/5ML SOLN Take 5 mLs by mouth every 6 (six) hours as needed.  . [DISCONTINUED] predniSONE (DELTASONE) 10 MG tablet Take 4 tabs (40 mg) PO x 3 days, then take 2 tabs (20 mg) PO x 3 days,  then take 1 tab (10 mg) PO x 3 days, then take 1/2 tab (5 mg) PO x 4 days.  . [DISCONTINUED] promethazine-dextromethorphan (PROMETHAZINE-DM) 6.25-15 MG/5ML syrup Take 5 mLs by mouth 4 (four) times daily as needed for cough.    Allergies:   Patient has no known allergies.  Review of Systems (ROS):  Review of systems NEGATIVE unless otherwise noted in narrative H&P section.   Vital Signs: Today's Vitals   10/16/19 0928 10/16/19 0930 10/16/19 1003  BP:  118/78   Pulse:  89   Resp:  18   Temp:  97.6 F (36.4 C)   TempSrc:  Oral   SpO2:  98%   Weight: 175 lb (79.4 kg)    Height: 5\' 5"  (1.651 m)    PainSc: 10-Worst pain ever  10-Worst pain ever    Physical Exam: Physical Exam  Constitutional: She is oriented to person, place, and time and well-developed, well-nourished, and in no distress.  HENT:  Head: Normocephalic and atraumatic.  Eyes: Pupils are equal, round, and reactive to light.  Cardiovascular: Normal rate, regular rhythm, normal heart sounds and intact distal pulses.  Pulmonary/Chest: Effort normal and breath sounds normal.  Musculoskeletal:     Cervical back: Full passive range of motion without pain.     Lumbar back: Pain (overlying paralumbar muscle group and SI joints BILATERALLY) and spasms present. No deformity. Decreased range of motion.       Back:     Comments: No midline neck/back pain or gross deformities.   Neurological: She is alert and oriented to person, place, and time. Gait normal.  Skin: Skin is warm and dry. No rash noted. She is not diaphoretic.  Psychiatric: Memory, affect and judgment normal. Her mood appears anxious.  Nursing note and vitals reviewed.   Urgent Care Treatments / Results:   No orders of the defined types were placed in this encounter.   LABS: PLEASE NOTE: all labs that were ordered this encounter are listed, however only abnormal results are displayed. Labs Reviewed - No data to display  EKG: -None  RADIOLOGY: No  results found.  PROCEDURES: Procedures  MEDICATIONS RECEIVED THIS VISIT: Medications - No data to display  PERTINENT CLINICAL COURSE NOTES/UPDATES:   Initial Impression / Assessment and Plan / Urgent Care Course:  Pertinent labs & imaging results that were available during my care of the patient were personally reviewed by me and considered in my medical decision making (see lab/imaging section of note for values and interpretations).  Nicole Singh is a 59 y.o. female who presents to [facility] today with complaints of Back Pain  Patient is well appearing overall in clinic today. She does not appear to be in any acute distress. Presenting symptoms (see HPI) and exam as documented above.  Exam reveals tenderness and definitive spasm overlying the paralumbar muscle groups and SI joints BILATERALLY.  Patient denies any recent injury.  She has been using  conservative treatment methods at home including heat application and NSAIDs.  Patient has no past medical history of back issues/surgeries.  Given the absence of injury, the benefit of diagnostic radiographs felt to be low today, thus will defer. Will continue conservative treatment using anti-inflammatory (naproxen) medication and skeletal muscle relaxer (cyclobenzaprine). Shewas educated on complimentary modalities to help with her pain. Patient encouraged to rest and avoid twisting/bending/lifting.  She was encouraged to continue to apply moist heat TID-QID for at least 15-20 minutes at a time; written information provided on today's AVS. Due to the intensity of her pain, patient has been having difficulty sleeping.  Given her appearance in clinic today, I would like patient have something stronger on hand to utilize for more severe pain.  In review of her EMR, there are several DDI's noted between current medications and tramadol.  Will provide a short course of Norco 5/325 mg tablets for as needed use; discussed indications and  restrictions.  She was advised not to take the [facility] and opioid medications together as they could both cause her to be excessively somnolent.  Current clinical condition warrants patient being out of work in order to recover from her current injury/illness. She was provided with the appropriate documentation to provide to her place of employment that will allow for her to RTW on 10/19/2019 with no restrictions.   Discussed follow up with primary care physician in 1 week for re-evaluation. I have reviewed the follow up and strict return precautions for any new or worsening symptoms. Patient is aware of symptoms that would be deemed urgent/emergent, and would thus require further evaluation either here or in the emergency department. At the time of discharge, she verbalized understanding and consent with the discharge plan as it was reviewed with her. All questions were fielded by provider and/or clinic staff prior to patient discharge.    Final Clinical Impressions / Urgent Care Diagnoses:   Final diagnoses:  Acute bilateral low back pain without sciatica    New Prescriptions:  [place] Controlled Substance Registry consulted? Yes, I have consulted the [place] Controlled Substances Registry for this patient, and feel the risk/benefit ratio today is favorable for proceeding with this prescription for a controlled substance.  . Discussed use of controlled substance medication to treat her acute pain.  o Reviewed [employer] STOP Act regulations  o Clinic does not refill controlled substances over the phone without face to face evaluation.  . Safety precautions reviewed.  o Medications should not be bitten, chewed, sold, or taken with alcohol.  o Avoid use while working, driving, or operating heavy machinery.  o Side effects associated with the use of this particular medication reviewed. - Patient understands that this medication can cause CNS depression, increase her risk of falls, and even lead to overdose that may  result in death, if used outside of the parameters that she and I discussed.  With all of this in mind, she knowingly accepts the risks and responsibilities associated with intended course of treatment, and elects to responsibly proceed as discussed.  Meds ordered this encounter  Medications  . cyclobenzaprine (FLEXERIL) 10 MG tablet    Sig: Take 1 tablet (10 mg total) by mouth 2 (two) times daily as needed for muscle spasms.    Dispense:  20 tablet    Refill:  0  . HYDROcodone-acetaminophen (NORCO/VICODIN) 5-325 MG tablet    Sig: Take 1 tablet by mouth 3 (three) times daily as needed.    Dispense:  12 tablet  Refill:  0    Recommended Follow up Care:  Patient encouraged to follow up with the following provider within the specified time frame, or sooner as dictated by the severity of her symptoms. As always, she was instructed that for any urgent/emergent care needs, she should seek care either here or in the emergency department for more immediate evaluation.  Follow-up Information    PCP In 1 week.   Why: General reassessment of symptoms if not improving        NOTE: This note was prepared using Lobbyist along with smaller Company secretary. Despite my best ability to proofread, there is the potential that transcriptional errors may still occur from this process, and are completely unintentional.     Karen Kitchens, NP 10/17/19 [phone]

## 2019-10-31 ENCOUNTER — Ambulatory Visit
Admission: EM | Admit: 2019-10-31 | Discharge: 2019-10-31 | Disposition: A | Discharge status: Home / Self Care | Payer: BC Managed Care – PPO | Attending: Family Medicine | Admitting: Family Medicine

## 2019-10-31 ENCOUNTER — Other Ambulatory Visit: Payer: Self-pay

## 2019-10-31 ENCOUNTER — Encounter: Payer: Self-pay | Admitting: Emergency Medicine

## 2019-10-31 DIAGNOSIS — L600 Ingrowing nail: Secondary | ICD-10-CM

## 2019-10-31 MED ORDER — DOXYCYCLINE HYCLATE 100 MG PO CAPS: 100.0000 mg | ORAL_CAPSULE | Freq: Two times a day (BID) | ORAL | 0 refills | Status: DC

## 2019-10-31 MED ORDER — MUPIROCIN 2 % EX OINT: 1.0000 "application " | TOPICAL_OINTMENT | Freq: Two times a day (BID) | CUTANEOUS | 0 refills | Status: AC

## 2019-10-31 NOTE — ED Provider Notes (Signed)
[facility]    CSN: [id number] Arrival date & time: 10/31/19  1821      History   Chief Complaint Chief Complaint  Patient presents with  . Toe Pain   HPI  59 year old female presents with the above complaint.  Patient reports a "sore" of her right great toe over the past month.  Patient has erythema, swelling, and drainage from around the nailbed on the lateral aspect of the right great toe.  She has been soaking the area with peroxide, alcohol.  She has also applied antibiotic ointment without resolution.  Area is quite painful.  5/10 in severity.  Started after she cut her toenail.  No relieving factors.  No other associated symptoms.  No other complaints.  Past Medical History:  Diagnosis Date  . Acid reflux   . COVID-19 08/21/2019   diagnosed in urgent care on 08/21/2019  . Diabetes ([place])   . MI (myocardial infarction) ([place])   . Shingles 03/10/2019   Past Surgical History:  Procedure Laterality Date  . ABDOMINAL HYSTERECTOMY    . cardiac stents    . TUBAL LIGATION     OB History   No obstetric history on file.     Home Medications    Prior to Admission medications   Medication Sig Start Date End Date Taking? Authorizing Provider  albuterol (VENTOLIN HFA) 108 (90 Base) MCG/ACT inhaler Inhale 1-2 puffs into the lungs every 6 (six) hours as needed for wheezing or shortness of breath. 08/26/19  Yes Murrill, Aldona Bar, FNP  amitriptyline (ELAVIL) 25 MG tablet TAKE 1 TABLET BY MOUTH EVERY DAY EVERY NIGHT 01/28/19  Yes [provider]  aspirin EC 81 MG tablet Take 81 mg by mouth daily.   Yes [provider]  carvedilol (COREG) 6.25 MG tablet  02/06/19  Yes [provider]  clindamycin (CLEOCIN T) 1 % external solution APPLY TO AFFECTED AREA TWICE A DAY 02/01/19  Yes [provider]  cyclobenzaprine (FLEXERIL) 10 MG tablet Take 1 tablet (10 mg total) by mouth 2 (two) times daily as needed for muscle spasms. 10/16/19  Yes Karen Kitchens, NP  gabapentin (NEURONTIN) 100 MG capsule Take 1 capsule (100 mg total) by mouth at bedtime as needed. 03/19/19  Yes Karen Kitchens, NP  HYDROcodone-acetaminophen (NORCO/VICODIN) 5-325 MG tablet Take 1 tablet by mouth 3 (three) times daily as needed. 10/16/19  Yes Karen Kitchens, NP  insulin glargine (LANTUS) 100 UNIT/ML injection Inject into the skin daily.   Yes [provider]  metFORMIN (GLUCOPHAGE) 1000 MG tablet Take 1,000 mg by mouth 2 (two) times daily with a meal.   Yes [provider]  naproxen (NAPROSYN) 500 MG tablet Take 1 tablet (500 mg total) by mouth 2 (two) times daily as needed for moderate pain. 03/10/19  Yes Shariq Puig G, DO  pantoprazole (PROTONIX) 40 MG tablet Take 40 mg by mouth daily. 01/11/19  Yes [provider]  traZODone (DESYREL) 150 MG tablet TAKE 1 TABLET (150 MG TOTAL) BY MOUTH NIGHTLY AS NEEDED FOR SLEEP. 02/07/19  Yes [provider]  valACYclovir (VALTREX) 1000 MG tablet Take 1 tablet (1,000 mg total) by mouth 3 (three) times daily. 03/19/19  Yes Karen Kitchens, NP  doxycycline (VIBRAMYCIN) 100 MG capsule Take 1 capsule (100 mg total) by mouth 2 (two) times daily. 10/31/19   Coral Spikes, DO  mupirocin ointment (BACTROBAN) 2 % Apply 1 application topically 2 (two) times daily for 7 days. 10/31/19 11/07/19  Tommie Sams, DO    Family History Family History  Problem Relation Age of Onset  . Emphysema Mother   . Diabetes Father     Social History Social History   Tobacco Use  . Smoking status: Former Games developer  . Smokeless tobacco: Never Used  Substance Use Topics  . Alcohol use: Yes    Comment: social  . Drug use: Never     Allergies   Patient has no known allergies.   Review of Systems Review of Systems  Constitutional: Negative.   Skin:       Great toe - pain/infection.   Physical Exam Triage Vital Signs ED Triage Vitals  Enc Vitals Group     BP 10/31/19 1842 (!) 169/70     Pulse Rate 10/31/19 1842 69       Resp 10/31/19 1842 18     Temp 10/31/19 1842 98.3 F (36.8 C)     Temp Source 10/31/19 1842 Oral     SpO2 10/31/19 1842 98 %     Weight 10/31/19 1840 180 lb (81.6 kg)     Height 10/31/19 1840 5\' 5"  (1.651 m)     Head Circumference --      Peak Flow --      Pain Score 10/31/19 1840 5     Pain Loc --      Pain Edu? --      Excl. in GC? --    Updated Vital Signs BP (!) 169/70 (BP Location: Right Arm)   Pulse 69   Temp 98.3 F (36.8 C) (Oral)   Resp 18   Ht 5\' 5"  (1.651 m)   Wt 81.6 kg   SpO2 98%   BMI 29.95 kg/m   Visual Acuity Right Eye Distance:   Left Eye Distance:   Bilateral Distance:    Right Eye Near:   Left Eye Near:    Bilateral Near:     Physical Exam Vitals and nursing note reviewed.  Constitutional:      General: She is not in acute distress.    Appearance: Normal appearance. She is not ill-appearing.  HENT:     Head: Normocephalic and atraumatic.  Eyes:     General:        Right eye: No discharge.        Left eye: No discharge.     Conjunctiva/sclera: Conjunctivae normal.  Pulmonary:     Effort: Pulmonary effort is normal. No respiratory distress.  Musculoskeletal:     Comments: Right great toe -lateral nailbed with erythema, purulent drainage.  Neurological:     Mental Status: She is alert.  Psychiatric:        Mood and Affect: Mood normal.        Behavior: Behavior normal.    UC Treatments / Results  Labs (all labs ordered are listed, but only abnormal results are displayed) Labs Reviewed - No data to display  EKG   Radiology No results found.  Procedures Procedures (including critical care time)  Medications Ordered in UC Medications - No data to display  Initial Impression / Assessment and Plan / UC Course  I have reviewed the triage vital signs and the nursing notes.  Pertinent labs & imaging results that were available during my care of the patient were reviewed by me and considered in my medical decision making (see  chart for details).    59 year old female presents with an ingrown toenail with infection.  Treating with doxycycline and mupirocin.  Advised to see podiatry if persists/does not improve.  Final Clinical Impressions(s) / UC Diagnoses   Final diagnoses:  Ingrown toenail with infection     Discharge Instructions     Medications as prescribed.  If persists, see [facility].  Take care  Dr. Adriana Simas    ED Prescriptions    Medication Sig Dispense Auth. Provider   doxycycline (VIBRAMYCIN) 100 MG capsule Take 1 capsule (100 mg total) by mouth 2 (two) times daily. 14 capsule Edyth Glomb G, DO   mupirocin ointment (BACTROBAN) 2 % Apply 1 application topically 2 (two) times daily for 7 days. 22 g Tommie Sams, DO     PDMP not reviewed this encounter.   Tommie Sams, [place] 10/31/19 1930

## 2019-10-31 NOTE — ED Triage Notes (Signed)
Patient c/o sore on her right great toe x 1 month. She states she is diabetic and the area is draining.

## 2019-10-31 NOTE — Discharge Instructions (Signed)
Medications as prescribed.  If persists, see [facility].  Take care  Dr. Adriana Simas

## 2019-11-14 ENCOUNTER — Other Ambulatory Visit: Payer: Self-pay | Admitting: Family Medicine

## 2020-01-01 DIAGNOSIS — E782 Mixed hyperlipidemia: Secondary | ICD-10-CM | POA: Insufficient documentation

## 2020-02-12 DIAGNOSIS — Z789 Other specified health status: Secondary | ICD-10-CM | POA: Insufficient documentation

## 2020-05-25 ENCOUNTER — Encounter: Payer: Self-pay | Admitting: Emergency Medicine

## 2020-05-25 ENCOUNTER — Other Ambulatory Visit: Payer: Self-pay

## 2020-05-25 ENCOUNTER — Ambulatory Visit
Admission: EM | Admit: 2020-05-25 | Discharge: 2020-05-25 | Disposition: A | Discharge status: Home / Self Care | Payer: Managed Care, Other (non HMO) | Attending: Family Medicine | Admitting: Family Medicine

## 2020-05-25 ENCOUNTER — Ambulatory Visit (INDEPENDENT_AMBULATORY_CARE_PROVIDER_SITE_OTHER): Payer: Managed Care, Other (non HMO)

## 2020-05-25 DIAGNOSIS — Z79899 Other long term (current) drug therapy: Secondary | ICD-10-CM | POA: Diagnosis not present

## 2020-05-25 DIAGNOSIS — Z833 Family history of diabetes mellitus: Secondary | ICD-10-CM | POA: Insufficient documentation

## 2020-05-25 DIAGNOSIS — Z794 Long term (current) use of insulin: Secondary | ICD-10-CM | POA: Insufficient documentation

## 2020-05-25 DIAGNOSIS — Z8616 Personal history of COVID-19: Secondary | ICD-10-CM | POA: Diagnosis not present

## 2020-05-25 DIAGNOSIS — R042 Hemoptysis: Secondary | ICD-10-CM

## 2020-05-25 DIAGNOSIS — I252 Old myocardial infarction: Secondary | ICD-10-CM | POA: Insufficient documentation

## 2020-05-25 DIAGNOSIS — Z20822 Contact with and (suspected) exposure to covid-19: Secondary | ICD-10-CM | POA: Diagnosis not present

## 2020-05-25 DIAGNOSIS — R0989 Other specified symptoms and signs involving the circulatory and respiratory systems: Secondary | ICD-10-CM | POA: Diagnosis not present

## 2020-05-25 DIAGNOSIS — E119 Type 2 diabetes mellitus without complications: Secondary | ICD-10-CM | POA: Insufficient documentation

## 2020-05-25 DIAGNOSIS — Z955 Presence of coronary angioplasty implant and graft: Secondary | ICD-10-CM | POA: Diagnosis not present

## 2020-05-25 DIAGNOSIS — Z7982 Long term (current) use of aspirin: Secondary | ICD-10-CM | POA: Insufficient documentation

## 2020-05-25 DIAGNOSIS — Z87891 Personal history of nicotine dependence: Secondary | ICD-10-CM | POA: Insufficient documentation

## 2020-05-25 DIAGNOSIS — R059 Cough, unspecified: Secondary | ICD-10-CM

## 2020-05-25 LAB — SARS CORONAVIRUS 2 (TAT 6-24 HRS): SARS Coronavirus 2: NEGATIVE

## 2020-05-25 MED ORDER — BENZONATATE 100 MG PO CAPS: 200.0000 mg | ORAL_CAPSULE | Freq: Three times a day (TID) | ORAL | 0 refills | Status: AC | PRN

## 2020-05-25 NOTE — ED Triage Notes (Signed)
Patient c/o cough and chest congestion for 3-4 days.  Patient denies fevers.  Patient reports coughing up blood this morning.

## 2020-05-25 NOTE — Discharge Instructions (Signed)
Isolate at home until the results f your COVID test are back. If it is positive you will need to quarantine for 10 days from the start of your symptoms. You can break quarantine if your symptoms improve and you have not had a fever for 24 hours.   Take the Tessalon perles every 8 hours as needed for cough. Use Mucinex to help break up chest congestion.  Follow-up with your PCP for another CT scan of your lungs to evaluate existing lung nodules.

## 2020-05-25 NOTE — ED Provider Notes (Signed)
[facility]    CSN: [id number] Arrival date & time: 05/25/20  0847      History   Chief Complaint Chief Complaint  Patient presents with  . Cough    HPI Nicole Singh is a 59 y.o. female.   59 yo female here for evaluation of cough and chest congestion for 2 weeks, ST x 5 days, and  Bloody sputum this AM.   She had COVID in January and had her second dose of Pfizer vaccine 4 weeks ago. She has a history of lung nodules that have been followed by [facility] with annual chest CT's but she has not had a CT in a couple of years. She denies being a smoker.   Additionally, she ahs had HA, body aches, and fatigue. She denies sweats or weight loss. No change to her sense of taste or smell either.      Past Medical History:  Diagnosis Date  . Acid reflux   . COVID-19 08/21/2019   diagnosed in urgent care on 08/21/2019  . Diabetes (HCC)   . MI (myocardial infarction) (HCC)   . Shingles 03/10/2019    There are no problems to display for this patient.   Past Surgical History:  Procedure Laterality Date  . ABDOMINAL HYSTERECTOMY    . cardiac stents    . TUBAL LIGATION      OB History   No obstetric history on file.      Home Medications    Prior to Admission medications   Medication Sig Start Date End Date Taking? Authorizing Provider  albuterol (VENTOLIN HFA) 108 (90 Base) MCG/ACT inhaler Inhale 1-2 puffs into the lungs every 6 (six) hours as needed for wheezing or shortness of breath. 08/26/19  Yes Murrill, Lelon Mast, FNP  amitriptyline (ELAVIL) 25 MG tablet TAKE 1 TABLET BY MOUTH EVERY DAY EVERY NIGHT 01/28/19  Yes [provider]  aspirin EC 81 MG tablet Take 81 mg by mouth daily.   Yes [provider]  carvedilol (COREG) 6.25 MG tablet  02/06/19  Yes [provider]  Dulaglutide 0.75 MG/0.5ML SOPN Inject into the skin. 01/08/20 02/11/21 Yes [provider]  insulin glargine (LANTUS) 100 UNIT/ML injection Inject  into the skin daily.   Yes [provider]  losartan (COZAAR) 50 MG tablet Take by mouth. 01/03/20 01/02/21 Yes [provider]  metFORMIN (GLUCOPHAGE) 1000 MG tablet Take 1,000 mg by mouth 2 (two) times daily with a meal.   Yes [provider]  pantoprazole (PROTONIX) 40 MG tablet Take 40 mg by mouth daily. 01/11/19  Yes [provider]  traZODone (DESYREL) 150 MG tablet TAKE 1 TABLET (150 MG TOTAL) BY MOUTH NIGHTLY AS NEEDED FOR SLEEP. 02/07/19  Yes [provider]  benzonatate (TESSALON) 100 MG capsule Take 2 capsules (200 mg total) by mouth 3 (three) times daily as needed for cough. 05/25/20   Becky Augusta, NP  Biotin 5000 MCG SUBL Place under the tongue.    [provider]  clindamycin (CLEOCIN T) 1 % external solution APPLY TO AFFECTED AREA TWICE A DAY 02/01/19   [provider]  cyclobenzaprine (FLEXERIL) 10 MG tablet Take 1 tablet (10 mg total) by mouth 2 (two) times daily as needed for muscle spasms. 10/16/19   Verlee Monte, NP  HYDROcodone-acetaminophen (NORCO/VICODIN) 5-325 MG tablet Take 1 tablet by mouth 3 (three) times daily as needed. 10/16/19   Verlee Monte, NP  naproxen (NAPROSYN) 500 MG tablet Take 1 tablet (  500 mg total) by mouth 2 (two) times daily as needed for moderate pain. 03/10/19   Tommie Samsook, Jayce G, DO  valACYclovir (VALTREX) 1000 MG tablet Take 1 tablet (1,000 mg total) by mouth 3 (three) times daily. 03/19/19   Verlee MonteGray, Bryan E, NP  gabapentin (NEURONTIN) 100 MG capsule Take 1 capsule (100 mg total) by mouth at bedtime as needed. 03/19/19 05/25/20  Verlee MonteGray, Bryan E, NP    Family History Family History  Problem Relation Age of Onset  . Emphysema Mother   . Diabetes Father     Social History Social History   Tobacco Use  . Smoking status: Former Games developermoker  . Smokeless tobacco: Never Used  Vaping Use  . Vaping Use: Never used  Substance Use Topics  . Alcohol use: Yes    Comment: social  . Drug use: Never      Allergies   Patient has no known allergies.   Review of Systems Review of Systems  Constitutional: Positive for fatigue. Negative for activity change, appetite change, fever and unexpected weight change.  HENT: Positive for sore throat. Negative for congestion, ear pain, postnasal drip, rhinorrhea, sinus pressure and sinus pain.   Respiratory: Positive for cough. Negative for shortness of breath and wheezing.   Cardiovascular: Positive for chest pain.       Patient had pain in her lung bases for 3 days, 1 week ago.   Gastrointestinal: Negative for abdominal pain, diarrhea, nausea and vomiting.  Genitourinary: Negative for dysuria and frequency.  Musculoskeletal: Positive for arthralgias and myalgias.  Skin: Negative.   Neurological: Negative for syncope.  Hematological: Positive for adenopathy.       Swollen glands in her neck that are painful.   Psychiatric/Behavioral: Negative.      Physical Exam Triage Vital Signs ED Triage Vitals  Enc Vitals Group     BP 05/25/20 0906 (!) 175/98     Pulse Rate 05/25/20 0906 78     Resp 05/25/20 0906 14     Temp 05/25/20 0906 98.1 F (36.7 C)     Temp Source 05/25/20 0906 Oral     SpO2 05/25/20 0906 97 %     Weight 05/25/20 0907 170 lb (77.1 kg)     Height 05/25/20 0907 5\' 5"  (1.651 m)     Head Circumference --      Peak Flow --      Pain Score 05/25/20 0907 0     Pain Loc --      Pain Edu? --      Excl. in GC? --    No data found.  Updated Vital Signs BP (!) 175/98 (BP Location: Left Arm) Comment: Patient states that she has not been taking her blood pressure medicines in the past couple of days.  Pulse 78   Temp 98.1 F (36.7 C) (Oral)   Resp 14   Ht 5\' 5"  (1.651 m)   Wt 170 lb (77.1 kg)   SpO2 97%   BMI 28.29 kg/m   Visual Acuity Right Eye Distance:   Left Eye Distance:   Bilateral Distance:    Right Eye Near:   Left Eye Near:    Bilateral Near:     Physical Exam Vitals and nursing note reviewed.   Constitutional:      General: She is not in acute distress.    Appearance: Normal appearance. She is not toxic-appearing.  HENT:     Head: Normocephalic and atraumatic.     Right Ear: Tympanic membrane,  ear canal and external ear normal.     Left Ear: Tympanic membrane and external ear normal.     Nose: Nose normal. No congestion or rhinorrhea.     Mouth/Throat:     Mouth: Mucous membranes are moist.     Pharynx: Oropharynx is clear. No oropharyngeal exudate or posterior oropharyngeal erythema.  Eyes:     Conjunctiva/sclera: Conjunctivae normal.     Pupils: Pupils are equal, round, and reactive to light.  Neck:     Comments: Marked shotty, tender anterior cervical lymphadenopathy.  Cardiovascular:     Rate and Rhythm: Normal rate and regular rhythm.     Pulses: Normal pulses.     Heart sounds: Normal heart sounds. No murmur heard.  No gallop.   Pulmonary:     Effort: Pulmonary effort is normal.     Breath sounds: Normal breath sounds. No wheezing, rhonchi or rales.  Musculoskeletal:        General: Normal range of motion.     Cervical back: Normal range of motion and neck supple.  Lymphadenopathy:     Cervical: Cervical adenopathy present.  Skin:    General: Skin is warm and dry.     Capillary Refill: Capillary refill takes less than 2 seconds.     Findings: No rash.  Neurological:     General: No focal deficit present.     Mental Status: She is alert and oriented to person, place, and time.  Psychiatric:        Mood and Affect: Mood normal.        Behavior: Behavior normal.        Thought Content: Thought content normal.        Judgment: Judgment normal.      UC Treatments / Results  Labs (all labs ordered are listed, but only abnormal results are displayed) Labs Reviewed  SARS CORONAVIRUS 2 (TAT 6-24 HRS)    EKG   Radiology DG Chest 2 View  Result Date: 05/25/2020 CLINICAL DATA:  Cough, hemoptysis EXAM: CHEST - 2 VIEW COMPARISON:  08/30/2019 FINDINGS: The  heart size and mediastinal contours are within normal limits. Atherosclerotic calcification of the aortic knob. Previously seen bibasilar airspace opacities have resolved. No focal airspace consolidation, pleural effusion, or pneumothorax. The visualized skeletal structures are unremarkable. IMPRESSION: No active cardiopulmonary disease. Electronically Signed   By: Duanne Guess D.O.   On: 05/25/2020 10:27    Procedures Procedures (including critical care time)  Medications Ordered in UC Medications - No data to display  Initial Impression / Assessment and Plan / UC Course  I have reviewed the triage vital signs and the nursing notes.  Pertinent labs & imaging results that were available during my care of the patient were reviewed by me and considered in my medical decision making (see chart for details).   Patinet is here for evaluation of a cough and chest congestion that has been going on for close to 2 weeks. She has had some pain in her lower lungs with deep breathing, and she developed a productive cough and ST 5 days ago. This morning she reports that she coughed up what she indicated was a quarter sized, deep red ball of sputum, and then less when she coughed again. She has a history of lung nodules that were being monitored with serial annual CT scans but she has not had a scan in a couple of years. She denies being a smoker, and she has not had a fever. She has  not traveled or had sick contacts. She has both had COVID and is fully vaccinated, her second Pfizer shot was 4 weeks ago.   Given presentation more concerning for neoplasm versus COVID or PNA.  Will obtain CXR, COVID collected by nursing staff.   CXR negative for active disease process per Radiology.  Will D/C home with Tessalon perles and Mucinex for congestion and have her isolate pending COVID test results.  Final Clinical Impressions(s) / UC Diagnoses   Final diagnoses:  Cough     Discharge Instructions       Isolate at home until the results f your COVID test are back. If it is positive you will need to quarantine for 10 days from the start of your symptoms. You can break quarantine if your symptoms improve and you have not had a fever for 24 hours.   Take the Tessalon perles every 8 hours as needed for cough. Use Mucinex to help break up chest congestion.  Follow-up with your PCP for another CT scan of your lungs to evaluate existing lung nodules.     ED Prescriptions    Medication Sig Dispense Auth. Provider   benzonatate (TESSALON) 100 MG capsule Take 2 capsules (200 mg total) by mouth 3 (three) times daily as needed for cough. 21 capsule Becky Augusta, NP     PDMP not reviewed this encounter.   Becky Augusta, NP 05/25/20 1037

## 2020-08-18 ENCOUNTER — Ambulatory Visit (INDEPENDENT_AMBULATORY_CARE_PROVIDER_SITE_OTHER): Payer: Managed Care, Other (non HMO)

## 2020-08-18 ENCOUNTER — Other Ambulatory Visit: Payer: Self-pay

## 2020-08-18 ENCOUNTER — Ambulatory Visit
Admission: EM | Admit: 2020-08-18 | Discharge: 2020-08-18 | Disposition: A | Discharge status: Home / Self Care | Payer: Managed Care, Other (non HMO)

## 2020-08-18 DIAGNOSIS — M79672 Pain in left foot: Secondary | ICD-10-CM | POA: Diagnosis not present

## 2020-08-18 DIAGNOSIS — M109 Gout, unspecified: Secondary | ICD-10-CM

## 2020-08-18 DIAGNOSIS — M7989 Other specified soft tissue disorders: Secondary | ICD-10-CM | POA: Diagnosis not present

## 2020-08-18 MED ORDER — PREDNISONE 10 MG (21) PO TBPK: ORAL_TABLET | Freq: Every day | ORAL | 0 refills | Status: AC

## 2020-08-18 MED ORDER — HYDROCODONE-ACETAMINOPHEN 5-325 MG PO TABS: 2.0000 | ORAL_TABLET | ORAL | 0 refills | Status: AC | PRN

## 2020-08-18 NOTE — Discharge Instructions (Addendum)
There is no evidence of a broken bone in your foot on your x-ray.  Your symptoms tonight are most consistent with gout.  Start the prednisone tomorrow morning and take according to the package instructions.  Use the Norco for this evening and as needed for severe pain.  Increase your water intake so that you are helping to flush your kidneys and eliminate the uric acid from your system.  Follow the low purine diet instructions given at discharge.  If your symptoms continue, or worsen return for reevaluation or see your primary care physician.

## 2020-08-18 NOTE — ED Triage Notes (Signed)
Pt c/o acute left foot pain/swelling onset Sunday.  Anterior metatarsal area and lateral area edematous, warm and with erythema.  Denies trauma/injury to area, n/v/d, abdominal pain, muscle pain.  Took Naproxen 440mg  at approx 1000

## 2020-08-18 NOTE — ED Provider Notes (Signed)
[facility]    CSN: [id number] Arrival date & time: 08/18/20  1617      History   Chief Complaint Chief Complaint  Patient presents with   Foot Pain    HPI Dalasia Predmore is a 59 y.o. female.   HPI   70-year-old female here for evaluation of pain, redness, and swelling of her left foot.  Patient reports that the pain started 2 days ago.  She reports that she had driven to [place] and had noticed that her foot has started to become painful and swollen.  She reports by yesterday that it was getting painful for her to get around, and today it is unbearable and she cannot put weight on her foot.  Patient is unaware of any trauma.  Patient denies any history of gout.  Is taking baby aspirin daily.  Past Medical History:  Diagnosis Date   Acid reflux    COVID-19 08/21/2019   diagnosed in urgent care on 08/21/2019   Diabetes [facility])    MI (myocardial infarction) (HCC)    Shingles 03/10/2019    There are no problems to display for this patient.   Past Surgical History:  Procedure Laterality Date   ABDOMINAL HYSTERECTOMY     cardiac stents     TUBAL LIGATION      OB History   No obstetric history on file.      Home Medications    Prior to Admission medications   Medication Sig Start Date End Date Taking? Authorizing Provider  amitriptyline (ELAVIL) 25 MG tablet TAKE 1 TABLET BY MOUTH EVERY DAY EVERY NIGHT 01/28/19  Yes [provider]  aspirin EC 81 MG tablet Take 81 mg by mouth daily.   Yes [provider]  carvedilol (COREG) 6.25 MG tablet  02/06/19  Yes [provider]  Dulaglutide 0.75 MG/0.5ML SOPN Inject into the skin. 01/08/20 02/11/21 Yes [provider]  HYDROcodone-acetaminophen (NORCO/VICODIN) 5-325 MG tablet Take 2 tablets by mouth every 4 (four) hours as needed. 08/18/20  Yes Becky Augusta, NP  insulin glargine (LANTUS) 100 UNIT/ML injection Inject into the skin daily.   Yes [provider]   losartan (COZAAR) 50 MG tablet Take by mouth. 01/03/20 01/02/21 Yes [provider]  metFORMIN (GLUCOPHAGE) 1000 MG tablet Take 1,000 mg by mouth 2 (two) times daily with a meal.   Yes [provider]  naproxen (NAPROSYN) 500 MG tablet Take 1 tablet (500 mg total) by mouth 2 (two) times daily as needed for moderate pain. 03/10/19  Yes Cook, Jayce G, DO  pantoprazole (PROTONIX) 40 MG tablet Take 40 mg by mouth daily. 01/11/19  Yes [provider]  predniSONE (STERAPRED UNI-PAK 21 TAB) 10 MG (21) TBPK tablet Take by mouth daily. Take 6 tabs by mouth daily  for 2 days, then 5 tabs for 2 days, then 4 tabs for 2 days, then 3 tabs for 2 days, 2 tabs for 2 days, then 1 tab by mouth daily for 2 days 08/18/20  Yes Becky Augusta, NP  spironolactone (ALDACTONE) 25 MG tablet Take by mouth. 08/10/20 08/10/21 Yes [provider]  traZODone (DESYREL) 150 MG tablet TAKE 1 TABLET (150 MG TOTAL) BY MOUTH NIGHTLY AS NEEDED FOR SLEEP. 02/07/19  Yes [provider]  albuterol (VENTOLIN HFA) 108 (90 Base) MCG/ACT inhaler Inhale 1-2 puffs into the lungs every 6 (six) hours as needed for wheezing or shortness of breath. 08/26/19   Lurline Idol, FNP  benzonatate (TESSALON) 100 MG capsule Take  2 capsules (200 mg total) by mouth 3 (three) times daily as needed for cough. 05/25/20   Becky Augusta, NP  Biotin 5000 MCG SUBL Place under the tongue.    [provider]  clindamycin (CLEOCIN T) 1 % external solution APPLY TO AFFECTED AREA TWICE A DAY 02/01/19   [provider]  cyclobenzaprine (FLEXERIL) 10 MG tablet Take 1 tablet (10 mg total) by mouth 2 (two) times daily as needed for muscle spasms. 10/16/19   Verlee Monte, NP  REPATHA SURECLICK 140 MG/ML SOAJ Inject into the skin. 07/24/20   [provider]  valACYclovir (VALTREX) 1000 MG tablet Take 1 tablet (1,000 mg total) by mouth 3 (three) times daily. 03/19/19   Verlee Monte, NP  gabapentin (NEURONTIN) 100 MG  capsule Take 1 capsule (100 mg total) by mouth at bedtime as needed. 03/19/19 05/25/20  Verlee Monte, NP    Family History Family History  Problem Relation Age of Onset   Emphysema Mother    Diabetes Father     Social History Social History   Tobacco Use   Smoking status: Former Smoker   Smokeless tobacco: Never Used  Building services engineer Use: Never used  Substance Use Topics   Alcohol use: Yes    Comment: social   Drug use: Never     Allergies   Lisinopril, Niacin, Ticagrelor, and Statins   Review of Systems Review of Systems  Constitutional: Negative for fever.  Musculoskeletal: Positive for arthralgias, joint swelling and myalgias.  Skin: Positive for color change.  Neurological: Positive for numbness.  Hematological: Negative.      Physical Exam Triage Vital Signs ED Triage Vitals  Enc Vitals Group     BP 08/18/20 1856 (!) 148/80     Pulse Rate 08/18/20 1900 79     Resp 08/18/20 1900 18     Temp 08/18/20 1856 98 F (36.7 C)     Temp Source 08/18/20 1856 Oral     SpO2 08/18/20 1900 100 %     Weight --      Height --      Head Circumference --      Peak Flow --      Pain Score 08/18/20 1851 9     Pain Loc --      Pain Edu? --      Excl. in GC? --    No data found.  Updated Vital Signs BP (!) 148/80 (BP Location: Right Arm)    Pulse 79    Temp 98 F (36.7 C) (Oral)    Resp 18    SpO2 100%   Visual Acuity Right Eye Distance:   Left Eye Distance:   Bilateral Distance:    Right Eye Near:   Left Eye Near:    Bilateral Near:     Physical Exam Vitals and nursing note reviewed.  Constitutional:      General: She is not in acute distress.    Appearance: Normal appearance. She is normal weight. She is not toxic-appearing.  HENT:     Head: Normocephalic and atraumatic.  Cardiovascular:     Rate and Rhythm: Normal rate and regular rhythm.     Pulses: Normal pulses.     Heart sounds: Normal heart sounds. No murmur heard. No gallop.    Pulmonary:     Effort: Pulmonary effort is normal.     Breath sounds: Normal breath sounds. No wheezing, rhonchi or rales.  Musculoskeletal:  General: Swelling and tenderness present.  Skin:    General: Skin is warm and dry.     Capillary Refill: Capillary refill takes less than 2 seconds.  Neurological:     General: No focal deficit present.     Mental Status: She is alert and oriented to person, place, and time.  Psychiatric:        Mood and Affect: Mood normal.        Behavior: Behavior normal.        Thought Content: Thought content normal.      UC Treatments / Results  Labs (all labs ordered are listed, but only abnormal results are displayed) Labs Reviewed - No data to display  EKG   Radiology DG Foot Complete Left  Result Date: 08/18/2020 CLINICAL DATA:  Acute left foot pain, swelling and redness for 2 days. No known injury. EXAM: LEFT FOOT - COMPLETE 3+ VIEW COMPARISON:  None. FINDINGS: There is no evidence of fracture or dislocation. There is no evidence of arthropathy or other focal bone abnormality. No erosions, bony destruction, or periosteal reaction. Small plantar calcaneal spur and Achilles tendon enthesophyte. Soft tissue edema overlies the dorsum of the foot. There is more focal soft tissue prominence lateral to the metatarsal phalangeal joint. No soft tissue air. No radiopaque foreign body IMPRESSION: 1. Soft tissue edema overlies the dorsum of the foot. More focal soft tissue prominence lateral to the metatarsophalangeal joint. No associated osseous abnormality. 2. Small plantar calcaneal spur and Achilles tendon enthesophyte. Electronically Signed   By: Narda Rutherford M.D.   On: 08/18/2020 19:28    Procedures Procedures (including critical care time)  Medications Ordered in UC Medications - No data to display  Initial Impression / Assessment and Plan / UC Course  I have reviewed the triage vital signs and the nursing notes.  Pertinent labs &  imaging results that were available during my care of the patient were reviewed by me and considered in my medical decision making (see chart for details).   Is here for evaluation of left foot pain that started 2 days ago.  Patient's left midfoot from the MTP joints of her second through fifth toe up to the level of her tarsal bones is erythematous, hot to touch, and tender.  Patient states that she has decreased sensation in her last 2 toes of her left foot.  Patient has limited range of motion due to pain.  DC and PT pulses of her left foot are 2+.  Suspect patient may have new onset gout given the setting of previous MI and on aspirin therapy.  Patient denies any trauma but does state that she wears high heels a lot.  Will obtain x-ray of her left foot to rule out stress fracture.  If the x-ray is negative we will treat patient with a steroid Dosepak, elevation, improve hydration, and have her follow-up with her primary care provider.  The left foot just shows soft tissue swelling.  Discharge patient home with a diagnosis of gout and treat her with a steroid Dosepak.  We will also give some Norco to use for tonight to help with pain.   Final Clinical Impressions(s) / UC Diagnoses   Final diagnoses:  Acute gout of left foot, unspecified cause     Discharge Instructions     There is no evidence of a broken bone in your foot on your x-ray.  Your symptoms tonight are most consistent with gout.  Start the prednisone tomorrow morning and  take according to the package instructions.  Use the Norco for this evening and as needed for severe pain.  Increase your water intake so that you are helping to flush your kidneys and eliminate the uric acid from your system.  Follow the low purine diet instructions given at discharge.  If your symptoms continue, or worsen return for reevaluation or see your primary care physician.    ED Prescriptions    Medication Sig Dispense Auth. Provider    predniSONE (STERAPRED UNI-PAK 21 TAB) 10 MG (21) TBPK tablet Take by mouth daily. Take 6 tabs by mouth daily  for 2 days, then 5 tabs for 2 days, then 4 tabs for 2 days, then 3 tabs for 2 days, 2 tabs for 2 days, then 1 tab by mouth daily for 2 days 42 tablet Becky Augustayan, Jerri Glauser, NP   HYDROcodone-acetaminophen (NORCO/VICODIN) 5-325 MG tablet Take 2 tablets by mouth every 4 (four) hours as needed. 6 tablet Becky Augustayan, Tressie Ragin, NP     I have reviewed the PDMP during this encounter.   Becky Augustayan, Lyndon Chapel, NP 08/18/20 [phone]

## 2021-04-14 ENCOUNTER — Encounter: Payer: Managed Care, Other (non HMO) | Attending: Cardiology | Admitting: *Deleted

## 2021-04-14 ENCOUNTER — Other Ambulatory Visit: Payer: Self-pay

## 2021-04-14 DIAGNOSIS — Z955 Presence of coronary angioplasty implant and graft: Secondary | ICD-10-CM

## 2021-04-14 NOTE — Progress Notes (Signed)
Initial telephone orientation completed. Diagnosis can be found in [facility] 8/4. EP orientation scheduled Wednesday 9/7 at 11am.

## 2021-04-28 ENCOUNTER — Encounter: Payer: Managed Care, Other (non HMO) | Attending: Cardiology

## 2021-04-28 ENCOUNTER — Other Ambulatory Visit: Payer: Self-pay

## 2021-04-28 VITALS — Ht 66.0 in | Wt 187.1 lb

## 2021-04-28 DIAGNOSIS — Z955 Presence of coronary angioplasty implant and graft: Secondary | ICD-10-CM | POA: Insufficient documentation

## 2021-04-28 NOTE — Patient Instructions (Signed)
Patient Instructions  Patient Details  Name: Nicole Singh MRN: [id number] Date of Birth: 01/11/1961 Referring Provider:  Estell Harpin,*  Below are your personal goals for exercise, nutrition, and risk factors. Our goal is to help you stay on track towards obtaining and maintaining these goals. We will be discussing your progress on these goals with you throughout the program.  Initial Exercise Prescription:  Initial Exercise Prescription - 04/28/21 1300       Date of Initial Exercise RX and Referring Provider   Date 04/28/21    Referring Provider Vista Lawman MD      Treadmill   MPH 2.4    Grade 0    Minutes 15    METs 2.84      Recumbant Bike   Level 2    RPM 60    Watts 20    Minutes 15    METs 3.2      NuStep   Level 2    SPM 80    Minutes 15    METs 3.2      REL-XR   Level 2    Speed 50    Minutes 15    METs 3.2      T5 Nustep   Level 1    SPM 80    Minutes 15    METs 3.2      Biostep-RELP   Level 1    SPM 50    Minutes 15    METs 3.2      Prescription Details   Frequency (times per week) 3    Duration Progress to 30 minutes of continuous aerobic without signs/symptoms of physical distress      Intensity   THRR 40-80% of Max Heartrate 115-146    Ratings of Perceived Exertion 11-13    Perceived Dyspnea 0-4      Progression   Progression Continue to progress workloads to maintain intensity without signs/symptoms of physical distress.      Resistance Training   Training Prescription Yes    Weight 3 lb    Reps 10-15             Exercise Goals: Frequency: Be able to perform aerobic exercise two to three times per week in program working toward 2-5 days per week of home exercise.  Intensity: Work with a perceived exertion of 11 (fairly light) - 15 (hard) while following your exercise prescription.  We will make changes to your prescription with you as you progress through the program.   Duration: Be able to do 30 to 45  minutes of continuous aerobic exercise in addition to a 5 minute warm-up and a 5 minute cool-down routine.   Nutrition Goals: Your personal nutrition goals will be established when you do your nutrition analysis with the dietician.  The following are general nutrition guidelines to follow: Cholesterol < 200mg /day Sodium < 1500mg /day Fiber: Women over 50 yrs - 21 grams per day  Personal Goals:  Personal Goals and Risk Factors at Admission - 04/28/21 1310       Core Components/Risk Factors/Patient Goals on Admission    Weight Management Yes;Weight Loss    Intervention Weight Management: Develop a combined nutrition and exercise program designed to reach desired caloric intake, while maintaining appropriate intake of nutrient and fiber, sodium and fats, and appropriate energy expenditure required for the weight goal.;Weight Management: Provide education and appropriate resources to help participant work on and attain dietary goals.;Weight Management/Obesity: Establish reasonable short term and long term  weight goals.    Admit Weight 187 lb (84.8 kg)    Goal Weight: Short Term 182 lb (82.6 kg)    Goal Weight: Long Term 175 lb (79.4 kg)    Expected Outcomes Short Term: Continue to assess and modify interventions until short term weight is achieved;Weight Loss: Understanding of general recommendations for a balanced deficit meal plan, which promotes 1-2 lb weight loss per week and includes a negative energy balance of [phone] kcal/d;Understanding recommendations for meals to include 15-35% energy as protein, 25-35% energy from fat, 35-60% energy from carbohydrates, less than 200mg  of dietary cholesterol, 20-35 gm of total fiber daily;Understanding of distribution of calorie intake throughout the day with the consumption of 4-5 meals/snacks    Diabetes Yes    Intervention Provide education about signs/symptoms and action to take for hypo/hyperglycemia.;Provide education about proper nutrition,  including hydration, and aerobic/resistive exercise prescription along with prescribed medications to achieve blood glucose in normal ranges: Fasting glucose 65-99 mg/dL    Expected Outcomes Long Term: Attainment of HbA1C < 7%.;Short Term: Participant verbalizes understanding of the signs/symptoms and immediate care of hyper/hypoglycemia, proper foot care and importance of medication, aerobic/resistive exercise and nutrition plan for blood glucose control.    Hypertension Yes    Intervention Provide education on lifestyle modifcations including regular physical activity/exercise, weight management, moderate sodium restriction and increased consumption of fresh fruit, vegetables, and low fat dairy, alcohol moderation, and smoking cessation.;Monitor prescription use compliance.    Expected Outcomes Short Term: Continued assessment and intervention until BP is < 140/54mm HG in hypertensive participants. < 130/81mm HG in hypertensive participants with diabetes, heart failure or chronic kidney disease.;Long Term: Maintenance of blood pressure at goal levels.    Lipids Yes    Intervention Provide education and support for participant on nutrition & aerobic/resistive exercise along with prescribed medications to achieve LDL 70mg , HDL >40mg .    Expected Outcomes Short Term: Participant states understanding of desired cholesterol values and is compliant with medications prescribed. Participant is following exercise prescription and nutrition guidelines.;Long Term: Cholesterol controlled with medications as prescribed, with individualized exercise RX and with personalized nutrition plan. Value goals: LDL < 70mg , HDL > 40 mg.             Tobacco Use Initial Evaluation: Social History   Tobacco Use  Smoking Status Former  Smokeless Tobacco Never    Exercise Goals and Review:  Exercise Goals     Row Name 04/28/21 1235             Exercise Goals   Increase Physical Activity Yes        Intervention Provide advice, education, support and counseling about physical activity/exercise needs.;Develop an individualized exercise prescription for aerobic and resistive training based on initial evaluation findings, risk stratification, comorbidities and participant's personal goals.       Expected Outcomes Long Term: Add in home exercise to make exercise part of routine and to increase amount of physical activity.;Short Term: Attend rehab on a regular basis to increase amount of physical activity.;Long Term: Exercising regularly at least 3-5 days a week.       Increase Strength and Stamina Yes       Intervention Provide advice, education, support and counseling about physical activity/exercise needs.;Develop an individualized exercise prescription for aerobic and resistive training based on initial evaluation findings, risk stratification, comorbidities and participant's personal goals.       Expected Outcomes Short Term: Increase workloads from initial exercise prescription for resistance, speed, and  METs.;Short Term: Perform resistance training exercises routinely during rehab and add in resistance training at home;Long Term: Improve cardiorespiratory fitness, muscular endurance and strength as measured by increased METs and functional capacity ( )       Able to understand and use rate of perceived exertion (RPE) scale Yes       Intervention Provide education and explanation on how to use RPE scale       Expected Outcomes Short Term: Able to use RPE daily in rehab to express subjective intensity level;Long Term:  Able to use RPE to guide intensity level when exercising independently       Able to understand and use Dyspnea scale Yes       Intervention Provide education and explanation on how to use Dyspnea scale       Expected Outcomes Short Term: Able to use Dyspnea scale daily in rehab to express subjective sense of shortness of breath during exertion;Long Term: Able to use Dyspnea scale to  guide intensity level when exercising independently       Knowledge and understanding of Target Heart Rate Range (THRR) Yes       Intervention Provide education and explanation of THRR including how the numbers were predicted and where they are located for reference       Expected Outcomes Short Term: Able to state/look up THRR;Long Term: Able to use THRR to govern intensity when exercising independently;Short Term: Able to use daily as guideline for intensity in rehab       Able to check pulse independently Yes       Intervention Provide education and demonstration on how to check pulse in carotid and radial arteries.;Review the importance of being able to check your own pulse for safety during independent exercise       Expected Outcomes Long Term: Able to check pulse independently and accurately;Short Term: Able to explain why pulse checking is important during independent exercise       Understanding of Exercise Prescription Yes       Intervention Provide education, explanation, and written materials on patient's individual exercise prescription       Expected Outcomes Short Term: Able to explain program exercise prescription;Long Term: Able to explain home exercise prescription to exercise independently                Copy of goals given to participant.

## 2021-04-28 NOTE — Progress Notes (Signed)
Cardiac Individual Treatment Plan  Patient Details  Name: Nicole Singh MRN: [id number] Date of Birth: [birth year] Referring Provider:   Flowsheet Row Cardiac Rehab from 04/28/2021 in [facility] Cardiac and Pulmonary Rehab  Referring Provider Vista Lawman MD       Initial Encounter Date:  Flowsheet Row Cardiac Rehab from 04/28/2021 in [facility] Cardiac and Pulmonary Rehab  Date 04/28/21       Visit Diagnosis: Status post coronary artery stent placement  Patient's Home Medications on Admission:  Current Outpatient Medications:    albuterol (VENTOLIN HFA) 108 (90 Base) MCG/ACT inhaler, Inhale 1-2 puffs into the lungs every 6 (six) hours as needed for wheezing or shortness of breath. (Patient not taking: Reported on 04/14/2021), Disp: 18 g, Rfl: 0   amitriptyline (ELAVIL) 25 MG tablet, TAKE 1 TABLET BY MOUTH EVERY DAY EVERY NIGHT, Disp: , Rfl:    aspirin EC 81 MG tablet, Take 81 mg by mouth daily., Disp: , Rfl:    benzonatate (TESSALON) 100 MG capsule, Take 2 capsules (200 mg total) by mouth 3 (three) times daily as needed for cough. (Patient not taking: Reported on 04/14/2021), Disp: 21 capsule, Rfl: 0   Biotin 5000 MCG SUBL, Place under the tongue., Disp: , Rfl:    carvedilol (COREG) 6.25 MG tablet, , Disp: , Rfl:    clindamycin (CLEOCIN T) 1 % external solution, APPLY TO AFFECTED AREA TWICE A DAY (Patient not taking: Reported on 04/14/2021), Disp: , Rfl:    clopidogrel (PLAVIX) 75 MG tablet, Take by mouth., Disp: , Rfl:    cyclobenzaprine (FLEXERIL) 10 MG tablet, Take 1 tablet (10 mg total) by mouth 2 (two) times daily as needed for muscle spasms., Disp: 20 tablet, Rfl: 0   Dulaglutide 1.5 MG/0.5ML SOPN, Inject into the skin., Disp: , Rfl:    furosemide (LASIX) 20 MG tablet, Take by mouth., Disp: , Rfl:    HYDROcodone-acetaminophen (NORCO/VICODIN) 5-325 MG tablet, Take 2 tablets by mouth every 4 (four) hours as needed. (Patient not taking: Reported on 04/14/2021), Disp: 6 tablet, Rfl: 0   insulin  glargine (LANTUS) 100 UNIT/ML injection, Inject into the skin daily., Disp: , Rfl:    Insulin Pen Needle (GLOBAL EASY GLIDE PEN NEEDLES) 32G X 4 MM MISC, Use 1 each nightly, Disp: , Rfl:    losartan (COZAAR) 50 MG tablet, Take by mouth., Disp: , Rfl:    metFORMIN (GLUCOPHAGE) 1000 MG tablet, Take 1,000 mg by mouth 2 (two) times daily with a meal., Disp: , Rfl:    naproxen (NAPROSYN) 500 MG tablet, Take 1 tablet (500 mg total) by mouth 2 (two) times daily as needed for moderate pain. (Patient not taking: Reported on 04/14/2021), Disp: 30 tablet, Rfl: 0   nitroGLYCERIN (NITROSTAT) 0.4 MG SL tablet, Place under the tongue., Disp: , Rfl:    Omega-3 Fatty Acids (FISH OIL) 1000 MG CAPS, Take 2 capsules by mouth 2 (two) times daily., Disp: , Rfl:    pantoprazole (PROTONIX) 40 MG tablet, Take 40 mg by mouth daily., Disp: , Rfl:    predniSONE (STERAPRED UNI-PAK 21 TAB) 10 MG (21) TBPK tablet, Take by mouth daily. Take 6 tabs by mouth daily  for 2 days, then 5 tabs for 2 days, then 4 tabs for 2 days, then 3 tabs for 2 days, 2 tabs for 2 days, then 1 tab by mouth daily for 2 days (Patient not taking: Reported on 04/14/2021), Disp: 42 tablet, Rfl: 0   REPATHA SURECLICK 140 MG/ML SOAJ, Inject into the skin.,  Disp: , Rfl:    spironolactone (ALDACTONE) 25 MG tablet, Take by mouth. (Patient not taking: Reported on 04/14/2021), Disp: , Rfl:    traZODone (DESYREL) 150 MG tablet, TAKE 1 TABLET (150 MG TOTAL) BY MOUTH NIGHTLY AS NEEDED FOR SLEEP., Disp: , Rfl:    valACYclovir (VALTREX) 1000 MG tablet, Take 1 tablet (1,000 mg total) by mouth 3 (three) times daily. (Patient not taking: Reported on 04/14/2021), Disp: 21 tablet, Rfl: 0   valACYclovir (VALTREX) 1000 MG tablet, Take by mouth., Disp: , Rfl:   Past Medical History: Past Medical History:  Diagnosis Date   Acid reflux    COVID-19 08/21/2019   diagnosed in urgent care on 08/21/2019   Diabetes [facility])    MI (myocardial infarction) (HCC)    Shingles 03/10/2019     Tobacco Use: Social History   Tobacco Use  Smoking Status Former  Smokeless Tobacco Never    Labs: Recent Review Flowsheet Data   There is no flowsheet data to display.      Exercise Target Goals: Exercise Program Goal: Individual exercise prescription set using results from initial 6 min walk test and THRR while considering  patient's activity barriers and safety.   Exercise Prescription Goal: Initial exercise prescription builds to 30-45 minutes a day of aerobic activity, 2-3 days per week.  Home exercise guidelines will be given to patient during program as part of exercise prescription that the participant will acknowledge.   Education: Aerobic Exercise: - Group verbal and visual presentation on the components of exercise prescription. Introduces F.I.T.T principle from ACSM for exercise prescriptions.  Reviews F.I.T.T. principles of aerobic exercise including progression. Written material given at graduation.   Education: Resistance Exercise: - Group verbal and visual presentation on the components of exercise prescription. Introduces F.I.T.T principle from ACSM for exercise prescriptions  Reviews F.I.T.T. principles of resistance exercise including progression. Written material given at graduation.    Education: Exercise & Equipment Safety: - Individual verbal instruction and demonstration of equipment use and safety with use of the equipment. Flowsheet Row Cardiac Rehab from 04/28/2021 in [facility] Cardiac and Pulmonary Rehab  Education need identified 04/28/21  Date 04/28/21  Educator KL  Instruction Review Code 1- Verbalizes Understanding       Education: Exercise Physiology & General Exercise Guidelines: - Group verbal and written instruction with models to review the exercise physiology of the cardiovascular system and associated critical values. Provides general exercise guidelines with specific guidelines to those with heart or lung disease.    Education:  Flexibility, Balance, Mind/Body Relaxation: - Group verbal and visual presentation with interactive activity on the components of exercise prescription. Introduces F.I.T.T principle from ACSM for exercise prescriptions. Reviews F.I.T.T. principles of flexibility and balance exercise training including progression. Also discusses the mind body connection.  Reviews various relaxation techniques to help reduce and manage stress (i.e. Deep breathing, progressive muscle relaxation, and visualization). Balance handout provided to take home. Written material given at graduation.   Activity Barriers & Risk Stratification:  Activity Barriers & Cardiac Risk Stratification - 04/28/21 1213       Activity Barriers & Cardiac Risk Stratification   Activity Barriers Other (comment);Joint Problems;Shortness of Breath;Chest Pain/Angina    Comments carpal tunnel surgery 02/12/21    Cardiac Risk Stratification High             6 Minute Walk:  6 Minute Walk     Row Name 04/28/21 1228         6 Minute Walk  Phase Initial     Distance 1165 feet     Walk Time 5 minutes     # of Rest Breaks 2  1:38-2:11; 4:02-4:32     MPH 2.64     METS 3.24     RPE 13     Perceived Dyspnea  2     VO2 Peak 11.35     Symptoms Yes (comment)     Comments Chest tightness 3/10- relieved with rest, SOB     Resting HR 84 bpm     Resting BP 128/68     Resting Oxygen Saturation  98 %     Exercise Oxygen Saturation  during 6 min walk 97 %     Max Ex. HR 106 bpm     Max Ex. BP 136/70     2 Minute Post BP 124/66              Oxygen Initial Assessment:   Oxygen Re-Evaluation:   Oxygen Discharge (Final Oxygen Re-Evaluation):   Initial Exercise Prescription:  Initial Exercise Prescription - 04/28/21 1300       Date of Initial Exercise RX and Referring Provider   Date 04/28/21    Referring Provider Vista Lawman MD      Treadmill   MPH 2.4    Grade 0    Minutes 15    METs 2.84      Recumbant Bike    Level 2    RPM 60    Watts 20    Minutes 15    METs 3.2      NuStep   Level 2    SPM 80    Minutes 15    METs 3.2      REL-XR   Level 2    Speed 50    Minutes 15    METs 3.2      T5 Nustep   Level 1    SPM 80    Minutes 15    METs 3.2      Biostep-RELP   Level 1    SPM 50    Minutes 15    METs 3.2      Prescription Details   Frequency (times per week) 3    Duration Progress to 30 minutes of continuous aerobic without signs/symptoms of physical distress      Intensity   THRR 40-80% of Max Heartrate 115-146    Ratings of Perceived Exertion 11-13    Perceived Dyspnea 0-4      Progression   Progression Continue to progress workloads to maintain intensity without signs/symptoms of physical distress.      Resistance Training   Training Prescription Yes    Weight 3 lb    Reps 10-15             Perform Capillary Blood Glucose checks as needed.  Exercise Prescription Changes:   Exercise Prescription Changes     Row Name 04/28/21 1300             Response to Exercise   Blood Pressure (Admit) 128/68       Blood Pressure (Exercise) 136/70       Blood Pressure (Exit) 124/66       Heart Rate (Admit) 84 bpm       Heart Rate (Exercise) 106 bpm       Heart Rate (Exit) 81 bpm       Oxygen Saturation (Admit) 98 %       Oxygen Saturation (  Exercise) 97 %       Oxygen Saturation (Exit) 97 %       Rating of Perceived Exertion (Exercise) 13       Perceived Dyspnea (Exercise) 2       Symptoms chest tightness 3/10- relieved with rest; SOB       Comments walk test results                Exercise Comments:   Exercise Goals and Review:   Exercise Goals     Row Name 04/28/21 1235             Exercise Goals   Increase Physical Activity Yes       Intervention Provide advice, education, support and counseling about physical activity/exercise needs.;Develop an individualized exercise prescription for aerobic and resistive training based on initial  evaluation findings, risk stratification, comorbidities and participant's personal goals.       Expected Outcomes Long Term: Add in home exercise to make exercise part of routine and to increase amount of physical activity.;Short Term: Attend rehab on a regular basis to increase amount of physical activity.;Long Term: Exercising regularly at least 3-5 days a week.       Increase Strength and Stamina Yes       Intervention Provide advice, education, support and counseling about physical activity/exercise needs.;Develop an individualized exercise prescription for aerobic and resistive training based on initial evaluation findings, risk stratification, comorbidities and participant's personal goals.       Expected Outcomes Short Term: Increase workloads from initial exercise prescription for resistance, speed, and METs.;Short Term: Perform resistance training exercises routinely during rehab and add in resistance training at home;Long Term: Improve cardiorespiratory fitness, muscular endurance and strength as measured by increased METs and functional capacity ( )       Able to understand and use rate of perceived exertion (RPE) scale Yes       Intervention Provide education and explanation on how to use RPE scale       Expected Outcomes Short Term: Able to use RPE daily in rehab to express subjective intensity level;Long Term:  Able to use RPE to guide intensity level when exercising independently       Able to understand and use Dyspnea scale Yes       Intervention Provide education and explanation on how to use Dyspnea scale       Expected Outcomes Short Term: Able to use Dyspnea scale daily in rehab to express subjective sense of shortness of breath during exertion;Long Term: Able to use Dyspnea scale to guide intensity level when exercising independently       Knowledge and understanding of Target Heart Rate Range (THRR) Yes       Intervention Provide education and explanation of THRR including how  the numbers were predicted and where they are located for reference       Expected Outcomes Short Term: Able to state/look up THRR;Long Term: Able to use THRR to govern intensity when exercising independently;Short Term: Able to use daily as guideline for intensity in rehab       Able to check pulse independently Yes       Intervention Provide education and demonstration on how to check pulse in carotid and radial arteries.;Review the importance of being able to check your own pulse for safety during independent exercise       Expected Outcomes Long Term: Able to check pulse independently and accurately;Short Term: Able to explain why pulse checking is  important during independent exercise       Understanding of Exercise Prescription Yes       Intervention Provide education, explanation, and written materials on patient's individual exercise prescription       Expected Outcomes Short Term: Able to explain program exercise prescription;Long Term: Able to explain home exercise prescription to exercise independently                Exercise Goals Re-Evaluation :   Discharge Exercise Prescription (Final Exercise Prescription Changes):  Exercise Prescription Changes - 04/28/21 1300       Response to Exercise   Blood Pressure (Admit) 128/68    Blood Pressure (Exercise) 136/70    Blood Pressure (Exit) 124/66    Heart Rate (Admit) 84 bpm    Heart Rate (Exercise) 106 bpm    Heart Rate (Exit) 81 bpm    Oxygen Saturation (Admit) 98 %    Oxygen Saturation (Exercise) 97 %    Oxygen Saturation (Exit) 97 %    Rating of Perceived Exertion (Exercise) 13    Perceived Dyspnea (Exercise) 2    Symptoms chest tightness 3/10- relieved with rest; SOB    Comments walk test results             Nutrition:  Target Goals: Understanding of nutrition guidelines, daily intake of sodium 1500mg , cholesterol 200mg , calories 30% from fat and 7% or less from saturated fats, daily to have 5 or more servings  of fruits and vegetables.  Education: All About Nutrition: -Group instruction provided by verbal, written material, interactive activities, discussions, models, and posters to present general guidelines for heart healthy nutrition including fat, fiber, MyPlate, the role of sodium in heart healthy nutrition, utilization of the nutrition label, and utilization of this knowledge for meal planning. Follow up email sent as well. Written material given at graduation. Flowsheet Row Cardiac Rehab from 04/28/2021 in [facility] Cardiac and Pulmonary Rehab  Education need identified 04/28/21       Biometrics:  Pre Biometrics - 04/28/21 1219       Pre Biometrics   Height  (1.676 m)    Weight 187 lb 1.6 oz (84.9 kg)    BMI (Calculated) 30.21    Single Leg Stand 30 seconds              Nutrition Therapy Plan and Nutrition Goals:  Nutrition Therapy & Goals - 04/28/21 1216       Intervention Plan   Intervention Prescribe, educate and counsel regarding individualized specific dietary modifications aiming towards targeted core components such as weight, hypertension, lipid management, diabetes, heart failure and other comorbidities.    Expected Outcomes Short Term Goal: Understand basic principles of dietary content, such as calories, fat, sodium, cholesterol and nutrients.;Long Term Goal: Adherence to prescribed nutrition plan.             Nutrition Assessments:  MEDIFICTS Score Key: ?70 Need to make dietary changes  40-70 Heart Healthy Diet ? 40 Therapeutic Level Cholesterol Diet  Flowsheet Row Cardiac Rehab from 04/28/2021 in [facility] Cardiac and Pulmonary Rehab  Picture Your Plate Total Score on Admission 59      Picture Your Plate Scores: <04 Unhealthy dietary pattern with much room for improvement. 41-50 Dietary pattern unlikely to meet recommendations for good health and room for improvement. 51-60 More healthful dietary pattern, with some room for improvement.  >60 Healthy  dietary pattern, although there may be some specific behaviors that could be improved.    Nutrition Goals  Re-Evaluation:   Nutrition Goals Discharge (Final Nutrition Goals Re-Evaluation):   Psychosocial: Target Goals: Acknowledge presence or absence of significant depression and/or stress, maximize coping skills, provide positive support system. Participant is able to verbalize types and ability to use techniques and skills needed for reducing stress and depression.   Education: Stress, Anxiety, and Depression - Group verbal and visual presentation to define topics covered.  Reviews how body is impacted by stress, anxiety, and depression.  Also discusses healthy ways to reduce stress and to treat/manage anxiety and depression.  Written material given at graduation.   Education: Sleep Hygiene -Provides group verbal and written instruction about how sleep can affect your health.  Define sleep hygiene, discuss sleep cycles and impact of sleep habits. Review good sleep hygiene tips.    Initial Review & Psychosocial Screening:  Initial Psych Review & Screening - 04/14/21 1040       Initial Review   Current issues with Current Stress Concerns    Source of Stress Concerns Occupation      Family Dynamics   Good Support System? Yes   husband     Barriers   Psychosocial barriers to participate in program There are no identifiable barriers or psychosocial needs.;The patient should benefit from training in stress management and relaxation.      Screening Interventions   Interventions Encouraged to exercise;Provide feedback about the scores to participant;To provide support and resources with identified psychosocial needs    Expected Outcomes Short Term goal: Utilizing psychosocial counselor, staff and physician to assist with identification of specific Stressors or current issues interfering with healing process. Setting desired goal for each stressor or current issue identified.;Long Term  Goal: Stressors or current issues are controlled or eliminated.;Short Term goal: Identification and review with participant of any Quality of Life or Depression concerns found by scoring the questionnaire.;Long Term goal: The participant improves quality of Life and PHQ9 Scores as seen by post scores and/or verbalization of changes             Quality of Life Scores:   Quality of Life - 04/28/21 1214       Quality of Life   Select Quality of Life      Quality of Life Scores   Health/Function Pre 10.8 %    Socioeconomic Pre 24.13 %    Psych/Spiritual Pre 14.57 %    Family Pre 26.4 %    GLOBAL Pre 16.83 %            Scores of 19 and below usually indicate a poorer quality of life in these areas.  A difference of  2-3 points is a clinically meaningful difference.  A difference of 2-3 points in the total score of the Quality of Life Index has been associated with significant improvement in overall quality of life, self-image, physical symptoms, and general health in studies assessing change in quality of life.  PHQ-9: Recent Review Flowsheet Data     Depression screen [facility] 2/9 04/28/2021   Decreased Interest 2   Down, Depressed, Hopeless 1   PHQ - 2 Score 3   Altered sleeping 0   Tired, decreased energy 1   Change in appetite 1   Feeling bad or failure about yourself  0   Trouble concentrating 0   Moving slowly or fidgety/restless 1   Suicidal thoughts 0   PHQ-9 Score 6   Difficult doing work/chores Somewhat difficult      Interpretation of Total Score  Total Score Depression  Severity:  1-4 = Minimal depression, 5-9 = Mild depression, 10-14 = Moderate depression, 15-19 = Moderately severe depression, 20-27 = Severe depression   Psychosocial Evaluation and Intervention:  Psychosocial Evaluation - 04/14/21 1101       Psychosocial Evaluation & Interventions   Interventions Encouraged to exercise with the program and follow exercise prescription;Stress management  education;Relaxation education    Comments Jonny is coming post stent to cardiac rehab. She is currenlty out of work until October so she has notices a huge decrease in her stress level. She states her job is extremely stressful and she doesn't know what it will be like when she goes back. Her husband is very supportive and has been by her side during her cardiac issues and her recent carpal tunnel surgery. She will need further interventions on her hands/wrists. She is motivated to attend cardiac rehab to gain knowledge about her a heart healthy lifestyle and get back to walking.    Expected Outcomes Short: attend cardiac rehab for education and exercise. Long: Develop and maintain positive self care habits.    Continue Psychosocial Services  Follow up required by staff             Psychosocial Re-Evaluation:   Psychosocial Discharge (Final Psychosocial Re-Evaluation):   Vocational Rehabilitation: Provide vocational rehab assistance to qualifying candidates.   Vocational Rehab Evaluation & Intervention:  Vocational Rehab - 04/14/21 1037       Initial Vocational Rehab Evaluation & Intervention   Assessment shows need for Vocational Rehabilitation No             Education: Education Goals: Education classes will be provided on a variety of topics geared toward better understanding of heart health and risk factor modification. Participant will state understanding/return demonstration of topics presented as noted by education test scores.  Learning Barriers/Preferences:  Learning Barriers/Preferences - 04/14/21 1037       Learning Barriers/Preferences   Learning Barriers None    Learning Preferences None             General Cardiac Education Topics:  AED/CPR: - Group verbal and written instruction with the use of models to demonstrate the basic use of the AED with the basic ABC's of resuscitation.   Anatomy and Cardiac Procedures: - Group verbal and visual  presentation and models provide information about basic cardiac anatomy and function. Reviews the testing methods done to diagnose heart disease and the outcomes of the test results. Describes the treatment choices: Medical Management, Angioplasty, or Coronary Bypass Surgery for treating various heart conditions including Myocardial Infarction, Angina, Valve Disease, and Cardiac Arrhythmias.  Written material given at graduation.   Medication Safety: - Group verbal and visual instruction to review commonly prescribed medications for heart and lung disease. Reviews the medication, class of the drug, and side effects. Includes the steps to properly store meds and maintain the prescription regimen.  Written material given at graduation.   Intimacy: - Group verbal instruction through game format to discuss how heart and lung disease can affect sexual intimacy. Written material given at graduation..   Know Your Numbers and Heart Failure: - Group verbal and visual instruction to discuss disease risk factors for cardiac and pulmonary disease and treatment options.  Reviews associated critical values for Overweight/Obesity, Hypertension, Cholesterol, and Diabetes.  Discusses basics of heart failure: signs/symptoms and treatments.  Introduces Heart Failure Zone chart for action plan for heart failure.  Written material given at graduation. Flowsheet Row Cardiac Rehab from 04/28/2021 in [facility]  Cardiac and Pulmonary Rehab  Education need identified 04/28/21       Infection Prevention: - Provides verbal and written material to individual with discussion of infection control including proper hand washing and proper equipment cleaning during exercise session. Flowsheet Row Cardiac Rehab from 04/28/2021 in [facility] Cardiac and Pulmonary Rehab  Education need identified 04/28/21  Date 04/28/21  Educator KL  Instruction Review Code 1- Verbalizes Understanding       Falls Prevention: - Provides verbal and written  material to individual with discussion of falls prevention and safety. Flowsheet Row Cardiac Rehab from 04/28/2021 in [facility] Cardiac and Pulmonary Rehab  Education need identified 04/28/21  Date 04/28/21  Educator KL  Instruction Review Code 1- Verbalizes Understanding       Other: -Provides group and verbal instruction on various topics (see comments)   Knowledge Questionnaire Score:  Knowledge Questionnaire Score - 04/28/21 1215       Knowledge Questionnaire Score   Pre Score 24/26: HF, Nutrition             Core Components/Risk Factors/Patient Goals at Admission:  Personal Goals and Risk Factors at Admission - 04/28/21 1310       Core Components/Risk Factors/Patient Goals on Admission    Weight Management Yes;Weight Loss    Intervention Weight Management: Develop a combined nutrition and exercise program designed to reach desired caloric intake, while maintaining appropriate intake of nutrient and fiber, sodium and fats, and appropriate energy expenditure required for the weight goal.;Weight Management: Provide education and appropriate resources to help participant work on and attain dietary goals.;Weight Management/Obesity: Establish reasonable short term and long term weight goals.    Admit Weight 187 lb (84.8 kg)    Goal Weight: Short Term 182 lb (82.6 kg)    Goal Weight: Long Term 175 lb (79.4 kg)    Expected Outcomes Short Term: Continue to assess and modify interventions until short term weight is achieved;Weight Loss: Understanding of general recommendations for a balanced deficit meal plan, which promotes 1-2 lb weight loss per week and includes a negative energy balance of [phone] kcal/d;Understanding recommendations for meals to include 15-35% energy as protein, 25-35% energy from fat, 35-60% energy from carbohydrates, less than 200mg  of dietary cholesterol, 20-35 gm of total fiber daily;Understanding of distribution of calorie intake throughout the day with the  consumption of 4-5 meals/snacks    Diabetes Yes    Intervention Provide education about signs/symptoms and action to take for hypo/hyperglycemia.;Provide education about proper nutrition, including hydration, and aerobic/resistive exercise prescription along with prescribed medications to achieve blood glucose in normal ranges: Fasting glucose 65-99 mg/dL    Expected Outcomes Long Term: Attainment of HbA1C < 7%.;Short Term: Participant verbalizes understanding of the signs/symptoms and immediate care of hyper/hypoglycemia, proper foot care and importance of medication, aerobic/resistive exercise and nutrition plan for blood glucose control.    Hypertension Yes    Intervention Provide education on lifestyle modifcations including regular physical activity/exercise, weight management, moderate sodium restriction and increased consumption of fresh fruit, vegetables, and low fat dairy, alcohol moderation, and smoking cessation.;Monitor prescription use compliance.    Expected Outcomes Short Term: Continued assessment and intervention until BP is < 140/28mm HG in hypertensive participants. < 130/55mm HG in hypertensive participants with diabetes, heart failure or chronic kidney disease.;Long Term: Maintenance of blood pressure at goal levels.    Lipids Yes    Intervention Provide education and support for participant on nutrition & aerobic/resistive exercise along with prescribed medications to achieve LDL 70mg ,  HDL >40mg .    Expected Outcomes Short Term: Participant states understanding of desired cholesterol values and is compliant with medications prescribed. Participant is following exercise prescription and nutrition guidelines.;Long Term: Cholesterol controlled with medications as prescribed, with individualized exercise RX and with personalized nutrition plan. Value goals: LDL < 70mg , HDL > 40 mg.             Education:Diabetes - Individual verbal and written instruction to review signs/symptoms  of diabetes, desired ranges of glucose level fasting, after meals and with exercise. Acknowledge that pre and post exercise glucose checks will be done for 3 sessions at entry of program. Flowsheet Row Card[facility] Re[facility]28[facility] Card[facility]  Education need identified 04/28/21  Date 04/28/21  Educator KL  Instruction Review Code 1- Verbalizes Understanding       Core Components/Risk Factors/Patient Goals Review:    Core Components/Risk Factors/Patient Goals at Discharge (Final Review):    ITP Comments:  ITP Comments     Row Name 04/14/21 1035 04/28/21 1235         ITP Comments Initial telephone orientation completed. Diagnosis can be found in [facility] 8/4. EP orientation scheduled Wednesday 9/7 at 11am. Completed 6MWT and gym orientation. Initial ITP created and sent for review to Dr. Bethann PunchesMark Miller, Medical Director.               Comments: Initial ITP

## 2021-05-03 ENCOUNTER — Other Ambulatory Visit: Payer: Self-pay

## 2021-05-03 ENCOUNTER — Encounter: Payer: Managed Care, Other (non HMO) | Admitting: *Deleted

## 2021-05-03 DIAGNOSIS — Z955 Presence of coronary angioplasty implant and graft: Secondary | ICD-10-CM | POA: Diagnosis not present

## 2021-05-03 LAB — GLUCOSE, CAPILLARY
Glucose-Capillary: 114 mg/dL — ABNORMAL HIGH (ref 70–99)
Glucose-Capillary: 112 mg/dL — ABNORMAL HIGH (ref 70–99)

## 2021-05-03 NOTE — Progress Notes (Signed)
Daily Session Note  Patient Details  Name: Nicole Singh MRN: [id number] Date of Birth: 1960/10/10 Referring Provider:   Flowsheet Row Cardiac Rehab from 04/28/2021 in [facility] Cardiac and Pulmonary Rehab  Referring Provider Clovis Pu MD       Encounter Date: 05/03/2021  Check In:  Session Check In - 05/03/21 0958       Check-In   Supervising physician immediately available to respond to emergencies See telemetry face sheet for immediately available ER MD    Location [facility]-Cardiac & Pulmonary Rehab    Staff Present Heath Lark, RN, BSN, Laveda Norman, BS, ACSM CEP, Exercise Physiologist;Joseph [place], RCP,RRT,BSRT;Amanda [place], [place], ACSM CEP, Exercise Physiologist    Virtual Visit No    Medication changes reported     No    Fall or balance concerns reported    No    Warm-up and Cool-down Performed on first and last piece of equipment    Resistance Training Performed Yes    VAD Patient? No    PAD/SET Patient? No      Pain Assessment   Currently in Pain? No/denies                Social History   Tobacco Use  Smoking Status Former  Smokeless Tobacco Never    Goals Met:  Exercise tolerated well No report of concerns or symptoms today  Goals Unmet:  Not Applicable  Comments: First full day of exercise!  Patient was oriented to gym and equipment including functions, settings, policies, and procedures.  Patient's individual exercise prescription and treatment plan were reviewed.  All starting workloads were established based on the results of the 6 minute walk test done at initial orientation visit.  The plan for exercise progression was also introduced and progression will be customized based on patient's performance and goals.    Dr. Emily Filbert is Medical Director for [place].  Dr. Ottie Glazier is Medical Director for [facility] Pulmonary Rehabilitation.

## 2021-05-05 ENCOUNTER — Other Ambulatory Visit: Payer: Self-pay

## 2021-05-05 DIAGNOSIS — Z955 Presence of coronary angioplasty implant and graft: Secondary | ICD-10-CM | POA: Diagnosis not present

## 2021-05-05 LAB — GLUCOSE, CAPILLARY
Glucose-Capillary: 151 mg/dL — ABNORMAL HIGH (ref 70–99)
Glucose-Capillary: 142 mg/dL — ABNORMAL HIGH (ref 70–99)

## 2021-05-05 NOTE — Progress Notes (Signed)
Daily Session Note  Patient Details  Name: Nicole Singh MRN: [id number] Date of Birth: 1961/06/15 Referring Provider:   Flowsheet Row Cardiac Rehab from 04/28/2021 in [facility] Cardiac and Pulmonary Rehab  Referring Provider Clovis Pu MD       Encounter Date: 05/05/2021  Check In:  Session Check In - 05/05/21 4287       Check-In   Supervising physician immediately available to respond to emergencies See telemetry face sheet for immediately available ER MD    Location [facility]-Cardiac & Pulmonary Rehab    Staff Present Birdie Sons, MPA, Elveria Rising, BA, ACSM CEP, Exercise Physiologist;Joseph Tessie Fass, [place]    Virtual Visit No    Medication changes reported     No    Fall or balance concerns reported    No    Warm-up and Cool-down Performed on first and last piece of equipment    Resistance Training Performed Yes    VAD Patient? No    PAD/SET Patient? No      Pain Assessment   Currently in Pain? No/denies                Social History   Tobacco Use  Smoking Status Former  Smokeless Tobacco Never    Goals Met:  Independence with exercise equipment Exercise tolerated well No report of concerns or symptoms today Strength training completed today  Goals Unmet:  Not Applicable  Comments: Pt able to follow exercise prescription today without complaint.  Will continue to monitor for progression.    Dr. Emily Filbert is Medical Director for [place].  Dr. Ottie Glazier is Medical Director for [facility] Pulmonary Rehabilitation.

## 2021-05-07 ENCOUNTER — Encounter: Payer: Managed Care, Other (non HMO) | Admitting: *Deleted

## 2021-05-07 ENCOUNTER — Other Ambulatory Visit: Payer: Self-pay

## 2021-05-07 DIAGNOSIS — Z955 Presence of coronary angioplasty implant and graft: Secondary | ICD-10-CM | POA: Diagnosis not present

## 2021-05-07 LAB — GLUCOSE, CAPILLARY
Glucose-Capillary: 202 mg/dL — ABNORMAL HIGH (ref 70–99)
Glucose-Capillary: 205 mg/dL — ABNORMAL HIGH (ref 70–99)

## 2021-05-07 NOTE — Progress Notes (Signed)
Daily Session Note  Patient Details  Name: Nicole Singh MRN: [id number] Date of Birth: 1961/07/25 Referring Provider:   Flowsheet Row Cardiac Rehab from 04/28/2021 in [facility] Cardiac and Pulmonary Rehab  Referring Provider Clovis Pu MD       Encounter Date: 05/07/2021  Check In:  Session Check In - 05/07/21 0913       Check-In   Supervising physician immediately available to respond to emergencies See telemetry face sheet for immediately available ER MD    Location [facility]-Cardiac & Pulmonary Rehab    Staff Present Renita Papa, RN BSN;Joseph [place], RCP,RRT,BSRT;Jessica [place], [place], RCEP, CCRP, CCET    Virtual Visit No    Medication changes reported     No    Fall or balance concerns reported    No    Warm-up and Cool-down Performed on first and last piece of equipment    Resistance Training Performed Yes    VAD Patient? No    PAD/SET Patient? No      Pain Assessment   Currently in Pain? No/denies                Social History   Tobacco Use  Smoking Status Former  Smokeless Tobacco Never    Goals Met:  Independence with exercise equipment Exercise tolerated well No report of concerns or symptoms today Strength training completed today  Goals Unmet:  Not Applicable  Comments: Pt able to follow exercise prescription today without complaint.  Will continue to monitor for progression.    Dr. Emily Filbert is Medical Director for [place].  Dr. Ottie Glazier is Medical Director for [facility] Pulmonary Rehabilitation.

## 2021-05-10 ENCOUNTER — Other Ambulatory Visit: Payer: Self-pay

## 2021-05-10 DIAGNOSIS — Z955 Presence of coronary angioplasty implant and graft: Secondary | ICD-10-CM

## 2021-05-10 NOTE — Progress Notes (Signed)
Daily Session Note  Patient Details  Name: Nicole Singh MRN: [id number] Date of Birth: June 01, 1961 Referring Provider:   Flowsheet Row Cardiac Rehab from 04/28/2021 in [facility] Cardiac and Pulmonary Rehab  Referring Provider Clovis Pu MD       Encounter Date: 05/10/2021  Check In:  Session Check In - 05/10/21 0936       Check-In   Supervising physician immediately available to respond to emergencies See telemetry face sheet for immediately available ER MD    Location [facility]-Cardiac & Pulmonary Rehab    Staff Present Birdie Sons, MPA, [place], BS, ACSM CEP, Exercise Physiologist;Joseph Tessie Fass, [place]    Virtual Visit No    Medication changes reported     No    Fall or balance concerns reported    No    Warm-up and Cool-down Performed on first and last piece of equipment    Resistance Training Performed Yes    VAD Patient? No    PAD/SET Patient? No      Pain Assessment   Currently in Pain? No/denies                Social History   Tobacco Use  Smoking Status Former  Smokeless Tobacco Never    Goals Met:  Independence with exercise equipment Exercise tolerated well No report of concerns or symptoms today Strength training completed today  Goals Unmet:  Not Applicable  Comments: Pt able to follow exercise prescription today without complaint.  Will continue to monitor for progression.    Dr. Emily Filbert is Medical Director for [place].  Dr. Ottie Glazier is Medical Director for [facility] Pulmonary Rehabilitation.

## 2021-05-12 ENCOUNTER — Other Ambulatory Visit: Payer: Self-pay

## 2021-05-12 DIAGNOSIS — Z955 Presence of coronary angioplasty implant and graft: Secondary | ICD-10-CM

## 2021-05-12 NOTE — Progress Notes (Signed)
Daily Session Note  Patient Details  Name: Nicole Singh MRN: [id number] Date of Birth: 1961-01-18 Referring Provider:   Flowsheet Row Cardiac Rehab from 04/28/2021 in [facility] Cardiac and Pulmonary Rehab  Referring Provider Clovis Pu MD       Encounter Date: 05/12/2021  Check In:  Session Check In - 05/12/21 0931       Check-In   Supervising physician immediately available to respond to emergencies See telemetry face sheet for immediately available ER MD    Location [facility]-Cardiac & Pulmonary Rehab    Staff Present Birdie Sons, MPA, Elveria Rising, BA, ACSM CEP, Exercise Physiologist;Joseph Tessie Fass, [place]    Virtual Visit No    Medication changes reported     No    Fall or balance concerns reported    No    Warm-up and Cool-down Performed on first and last piece of equipment    Resistance Training Performed Yes    VAD Patient? No    PAD/SET Patient? No      Pain Assessment   Currently in Pain? No/denies                Social History   Tobacco Use  Smoking Status Former  Smokeless Tobacco Never    Goals Met:  Independence with exercise equipment Exercise tolerated well No report of concerns or symptoms today Strength training completed today  Goals Unmet:  Not Applicable  Comments: Pt able to follow exercise prescription today without complaint.  Will continue to monitor for progression.    Dr. Emily Filbert is Medical Director for [place].  Dr. Ottie Glazier is Medical Director for [facility] Pulmonary Rehabilitation.

## 2021-05-26 ENCOUNTER — Encounter: Payer: Self-pay | Admitting: *Deleted

## 2021-05-26 DIAGNOSIS — Z955 Presence of coronary angioplasty implant and graft: Secondary | ICD-10-CM

## 2021-05-26 NOTE — Progress Notes (Signed)
Cardiac Individual Treatment Plan  Patient Details  Name: Nicole Singh MRN: [id number] Date of Birth: [birth year] Referring Provider:   Flowsheet Row Cardiac Rehab from 04/28/2021 in [facility] Cardiac and Pulmonary Rehab  Referring Provider Vista Lawman MD       Initial Encounter Date:  Flowsheet Row Cardiac Rehab from 04/28/2021 in [facility] Cardiac and Pulmonary Rehab  Date 04/28/21       Visit Diagnosis: Status post coronary artery stent placement  Patient's Home Medications on Admission:  Current Outpatient Medications:    albuterol (VENTOLIN HFA) 108 (90 Base) MCG/ACT inhaler, Inhale 1-2 puffs into the lungs every 6 (six) hours as needed for wheezing or shortness of breath. (Patient not taking: Reported on 04/14/2021), Disp: 18 g, Rfl: 0   amitriptyline (ELAVIL) 25 MG tablet, TAKE 1 TABLET BY MOUTH EVERY DAY EVERY NIGHT, Disp: , Rfl:    aspirin EC 81 MG tablet, Take 81 mg by mouth daily., Disp: , Rfl:    benzonatate (TESSALON) 100 MG capsule, Take 2 capsules (200 mg total) by mouth 3 (three) times daily as needed for cough. (Patient not taking: Reported on 04/14/2021), Disp: 21 capsule, Rfl: 0   Biotin 5000 MCG SUBL, Place under the tongue., Disp: , Rfl:    carvedilol (COREG) 6.25 MG tablet, , Disp: , Rfl:    clindamycin (CLEOCIN T) 1 % external solution, APPLY TO AFFECTED AREA TWICE A DAY (Patient not taking: Reported on 04/14/2021), Disp: , Rfl:    clopidogrel (PLAVIX) 75 MG tablet, Take by mouth., Disp: , Rfl:    cyclobenzaprine (FLEXERIL) 10 MG tablet, Take 1 tablet (10 mg total) by mouth 2 (two) times daily as needed for muscle spasms., Disp: 20 tablet, Rfl: 0   Dulaglutide 1.5 MG/0.5ML SOPN, Inject into the skin., Disp: , Rfl:    furosemide (LASIX) 20 MG tablet, Take by mouth., Disp: , Rfl:    HYDROcodone-acetaminophen (NORCO/VICODIN) 5-325 MG tablet, Take 2 tablets by mouth every 4 (four) hours as needed. (Patient not taking: Reported on 04/14/2021), Disp: 6 tablet, Rfl: 0   insulin  glargine (LANTUS) 100 UNIT/ML injection, Inject into the skin daily., Disp: , Rfl:    Insulin Pen Needle (GLOBAL EASY GLIDE PEN NEEDLES) 32G X 4 MM MISC, Use 1 each nightly, Disp: , Rfl:    losartan (COZAAR) 50 MG tablet, Take by mouth., Disp: , Rfl:    metFORMIN (GLUCOPHAGE) 1000 MG tablet, Take 1,000 mg by mouth 2 (two) times daily with a meal., Disp: , Rfl:    naproxen (NAPROSYN) 500 MG tablet, Take 1 tablet (500 mg total) by mouth 2 (two) times daily as needed for moderate pain. (Patient not taking: Reported on 04/14/2021), Disp: 30 tablet, Rfl: 0   nitroGLYCERIN (NITROSTAT) 0.4 MG SL tablet, Place under the tongue., Disp: , Rfl:    Omega-3 Fatty Acids (FISH OIL) 1000 MG CAPS, Take 2 capsules by mouth 2 (two) times daily., Disp: , Rfl:    pantoprazole (PROTONIX) 40 MG tablet, Take 40 mg by mouth daily., Disp: , Rfl:    predniSONE (STERAPRED UNI-PAK 21 TAB) 10 MG (21) TBPK tablet, Take by mouth daily. Take 6 tabs by mouth daily  for 2 days, then 5 tabs for 2 days, then 4 tabs for 2 days, then 3 tabs for 2 days, 2 tabs for 2 days, then 1 tab by mouth daily for 2 days (Patient not taking: Reported on 04/14/2021), Disp: 42 tablet, Rfl: 0   REPATHA SURECLICK 140 MG/ML SOAJ, Inject into the skin.,  Disp: , Rfl:    spironolactone (ALDACTONE) 25 MG tablet, Take by mouth. (Patient not taking: Reported on 04/14/2021), Disp: , Rfl:    traZODone (DESYREL) 150 MG tablet, TAKE 1 TABLET (150 MG TOTAL) BY MOUTH NIGHTLY AS NEEDED FOR SLEEP., Disp: , Rfl:    valACYclovir (VALTREX) 1000 MG tablet, Take 1 tablet (1,000 mg total) by mouth 3 (three) times daily. (Patient not taking: Reported on 04/14/2021), Disp: 21 tablet, Rfl: 0   valACYclovir (VALTREX) 1000 MG tablet, Take by mouth., Disp: , Rfl:   Past Medical History: Past Medical History:  Diagnosis Date   Acid reflux    COVID-19 08/21/2019   diagnosed in urgent care on 08/21/2019   Diabetes [facility])    MI (myocardial infarction) (HCC)    Shingles 03/10/2019     Tobacco Use: Social History   Tobacco Use  Smoking Status Former  Smokeless Tobacco Never    Labs: Recent Review Flowsheet Data   There is no flowsheet data to display.      Exercise Target Goals: Exercise Program Goal: Individual exercise prescription set using results from initial 6 min walk test and THRR while considering  patient's activity barriers and safety.   Exercise Prescription Goal: Initial exercise prescription builds to 30-45 minutes a day of aerobic activity, 2-3 days per week.  Home exercise guidelines will be given to patient during program as part of exercise prescription that the participant will acknowledge.   Education: Aerobic Exercise: - Group verbal and visual presentation on the components of exercise prescription. Introduces F.I.T.T principle from ACSM for exercise prescriptions.  Reviews F.I.T.T. principles of aerobic exercise including progression. Written material given at graduation.   Education: Resistance Exercise: - Group verbal and visual presentation on the components of exercise prescription. Introduces F.I.T.T principle from ACSM for exercise prescriptions  Reviews F.I.T.T. principles of resistance exercise including progression. Written material given at graduation.    Education: Exercise & Equipment Safety: - Individual verbal instruction and demonstration of equipment use and safety with use of the equipment. Flowsheet Row Cardiac Rehab from 05/12/2021 in [facility] Cardiac and Pulmonary Rehab  Education need identified 04/28/21  Date 04/28/21  Educator KL  Instruction Review Code 1- Verbalizes Understanding       Education: Exercise Physiology & General Exercise Guidelines: - Group verbal and written instruction with models to review the exercise physiology of the cardiovascular system and associated critical values. Provides general exercise guidelines with specific guidelines to those with heart or lung disease.    Education:  Flexibility, Balance, Mind/Body Relaxation: - Group verbal and visual presentation with interactive activity on the components of exercise prescription. Introduces F.I.T.T principle from ACSM for exercise prescriptions. Reviews F.I.T.T. principles of flexibility and balance exercise training including progression. Also discusses the mind body connection.  Reviews various relaxation techniques to help reduce and manage stress (i.e. Deep breathing, progressive muscle relaxation, and visualization). Balance handout provided to take home. Written material given at graduation.   Activity Barriers & Risk Stratification:  Activity Barriers & Cardiac Risk Stratification - 04/28/21 1213       Activity Barriers & Cardiac Risk Stratification   Activity Barriers Other (comment);Joint Problems;Shortness of Breath;Chest Pain/Angina    Comments carpal tunnel surgery 02/12/21    Cardiac Risk Stratification High             6 Minute Walk:  6 Minute Walk     Row Name 04/28/21 1228         6 Minute Walk  Phase Initial     Distance 1165 feet     Walk Time 5 minutes     # of Rest Breaks 2  1:38-2:11; 4:02-4:32     MPH 2.64     METS 3.24     RPE 13     Perceived Dyspnea  2     VO2 Peak 11.35     Symptoms Yes (comment)     Comments Chest tightness 3/10- relieved with rest, SOB     Resting HR 84 bpm     Resting BP 128/68     Resting Oxygen Saturation  98 %     Exercise Oxygen Saturation  during 6 min walk 97 %     Max Ex. HR 106 bpm     Max Ex. BP 136/70     2 Minute Post BP 124/66              Oxygen Initial Assessment:   Oxygen Re-Evaluation:   Oxygen Discharge (Final Oxygen Re-Evaluation):   Initial Exercise Prescription:  Initial Exercise Prescription - 04/28/21 1300       Date of Initial Exercise RX and Referring Provider   Date 04/28/21    Referring Provider Vista Lawman MD      Treadmill   MPH 2.4    Grade 0    Minutes 15    METs 2.84      Recumbant Bike    Level 2    RPM 60    Watts 20    Minutes 15    METs 3.2      NuStep   Level 2    SPM 80    Minutes 15    METs 3.2      REL-XR   Level 2    Speed 50    Minutes 15    METs 3.2      T5 Nustep   Level 1    SPM 80    Minutes 15    METs 3.2      Biostep-RELP   Level 1    SPM 50    Minutes 15    METs 3.2      Prescription Details   Frequency (times per week) 3    Duration Progress to 30 minutes of continuous aerobic without signs/symptoms of physical distress      Intensity   THRR 40-80% of Max Heartrate 115-146    Ratings of Perceived Exertion 11-13    Perceived Dyspnea 0-4      Progression   Progression Continue to progress workloads to maintain intensity without signs/symptoms of physical distress.      Resistance Training   Training Prescription Yes    Weight 3 lb    Reps 10-15             Perform Capillary Blood Glucose checks as needed.  Exercise Prescription Changes:   Exercise Prescription Changes     Row Name 04/28/21 1300 05/03/21 1100 05/19/21 1400         Response to Exercise   Blood Pressure (Admit) 128/68 124/70 118/66     Blood Pressure (Exercise) 136/70 130/70 118/56     Blood Pressure (Exit) 124/66 104/68 122/68     Heart Rate (Admit) 84 bpm 96 bpm 90 bpm     Heart Rate (Exercise) 106 bpm 109 bpm 107 bpm     Heart Rate (Exit) 81 bpm 99 bpm 91 bpm     Oxygen Saturation (Admit) 98 % -- --  Oxygen Saturation (Exercise) 97 % -- --     Oxygen Saturation (Exit) 97 % -- --     Rating of Perceived Exertion (Exercise) Perceived Dyspnea (Exercise) 2 -- --     Symptoms chest tightness 3/10- relieved with rest; SOB none chest pain4/10     Comments walk test results first full day of exercise --     Duration -- Progress to 30 minutes of  aerobic without signs/symptoms of physical distress Progress to 30 minutes of  aerobic without signs/symptoms of physical distress     Intensity -- THRR unchanged THRR unchanged            Progression   Progression -- Continue to progress workloads to maintain intensity without signs/symptoms of physical distress. Continue to progress workloads to maintain intensity without signs/symptoms of physical distress.     Average METs -- 2.84 2.4           Resistance Training   Training Prescription -- Yes Yes     Weight -- 3 lb 3 lb     Reps -- 10-15 10-15           Treadmill   MPH -- 2.4 2.4     Grade -- 0 0     Minutes -- 15 15     METs -- 2.84 2.84           T5 Nustep   Level -- 1 1     Minutes -- 15 15     METs -- -- 1.9              Exercise Comments:   Exercise Comments     Row Name 05/03/21 1000           Exercise Comments First full day of exercise!  Patient was oriented to gym and equipment including functions, settings, policies, and procedures.  Patient's individual exercise prescription and treatment plan were reviewed.  All starting workloads were established based on the results of the 6 minute walk test done at initial orientation visit.  The plan for exercise progression was also introduced and progression will be customized based on patient's performance and goals.                Exercise Goals and Review:   Exercise Goals     Row Name 04/28/21 1235             Exercise Goals   Increase Physical Activity Yes       Intervention Provide advice, education, support and counseling about physical activity/exercise needs.;Develop an individualized exercise prescription for aerobic and resistive training based on initial evaluation findings, risk stratification, comorbidities and participant's personal goals.       Expected Outcomes Long Term: Add in home exercise to make exercise part of routine and to increase amount of physical activity.;Short Term: Attend rehab on a regular basis to increase amount of physical activity.;Long Term: Exercising regularly at least 3-5 days a week.       Increase Strength and Stamina Yes       Intervention  Provide advice, education, support and counseling about physical activity/exercise needs.;Develop an individualized exercise prescription for aerobic and resistive training based on initial evaluation findings, risk stratification, comorbidities and participant's personal goals.       Expected Outcomes Short Term: Increase workloads from initial exercise prescription for resistance, speed, and METs.;Short Term: Perform resistance training exercises routinely during rehab and add in resistance  training at home;Long Term: Improve cardiorespiratory fitness, muscular endurance and strength as measured by increased METs and functional capacity ( )       Able to understand and use rate of perceived exertion (RPE) scale Yes       Intervention Provide education and explanation on how to use RPE scale       Expected Outcomes Short Term: Able to use RPE daily in rehab to express subjective intensity level;Long Term:  Able to use RPE to guide intensity level when exercising independently       Able to understand and use Dyspnea scale Yes       Intervention Provide education and explanation on how to use Dyspnea scale       Expected Outcomes Short Term: Able to use Dyspnea scale daily in rehab to express subjective sense of shortness of breath during exertion;Long Term: Able to use Dyspnea scale to guide intensity level when exercising independently       Knowledge and understanding of Target Heart Rate Range (THRR) Yes       Intervention Provide education and explanation of THRR including how the numbers were predicted and where they are located for reference       Expected Outcomes Short Term: Able to state/look up THRR;Long Term: Able to use THRR to govern intensity when exercising independently;Short Term: Able to use daily as guideline for intensity in rehab       Able to check pulse independently Yes       Intervention Provide education and demonstration on how to check pulse in carotid and radial  arteries.;Review the importance of being able to check your own pulse for safety during independent exercise       Expected Outcomes Long Term: Able to check pulse independently and accurately;Short Term: Able to explain why pulse checking is important during independent exercise       Understanding of Exercise Prescription Yes       Intervention Provide education, explanation, and written materials on patient's individual exercise prescription       Expected Outcomes Short Term: Able to explain program exercise prescription;Long Term: Able to explain home exercise prescription to exercise independently                Exercise Goals Re-Evaluation :  Exercise Goals Re-Evaluation     Row Name 05/03/21 1000 05/19/21 1457           Exercise Goal Re-Evaluation   Exercise Goals Review Able to understand and use rate of perceived exertion (RPE) scale;Able to understand and use Dyspnea scale;Knowledge and understanding of Target Heart Rate Range (THRR);Understanding of Exercise Prescription Increase Physical Activity;Increase Strength and Stamina      Comments Reviewed RPE and dyspnea scales, THR and program prescription with pt today.  Pt voiced understanding and was given a copy of goals to take home. Clarisse has had some chest pain during exercise that resolves with rest.  She saw her cardiologist last Friday.  She is out on vacation this week.  Staff will follow up when she returns.      Expected Outcomes Short: Use RPE daily to regulate intensity. Long: Follow program prescription in THR. Short : attend consistently Long: build overall stamina               Discharge Exercise Prescription (Final Exercise Prescription Changes):  Exercise Prescription Changes - 05/19/21 1400       Response to Exercise   Blood Pressure (Admit) 118/66  Blood Pressure (Exercise) 118/56    Blood Pressure (Exit) 122/68    Heart Rate (Admit) 90 bpm    Heart Rate (Exercise) 107 bpm    Heart Rate (Exit)  91 bpm    Rating of Perceived Exertion (Exercise) 15    Symptoms chest pain4/10    Duration Progress to 30 minutes of  aerobic without signs/symptoms of physical distress    Intensity THRR unchanged      Progression   Progression Continue to progress workloads to maintain intensity without signs/symptoms of physical distress.    Average METs 2.4      Resistance Training   Training Prescription Yes    Weight 3 lb    Reps 10-15      Treadmill   MPH 2.4    Grade 0    Minutes 15    METs 2.84      T5 Nustep   Level 1    Minutes 15    METs 1.9             Nutrition:  Target Goals: Understanding of nutrition guidelines, daily intake of sodium 1500mg , cholesterol 200mg , calories 30% from fat and 7% or less from saturated fats, daily to have 5 or more servings of fruits and vegetables.  Education: All About Nutrition: -Group instruction provided by verbal, written material, interactive activities, discussions, models, and posters to present general guidelines for heart healthy nutrition including fat, fiber, MyPlate, the role of sodium in heart healthy nutrition, utilization of the nutrition label, and utilization of this knowledge for meal planning. Follow up email sent as well. Written material given at graduation. Flowsheet Row Cardiac Rehab from 05/12/2021 in [facility] Cardiac and Pulmonary Rehab  Education need identified 04/28/21  Date 05/12/21  Educator MC  Instruction Review Code 1- Verbalizes Understanding       Biometrics:  Pre Biometrics - 04/28/21 1219       Pre Biometrics   Height 5\' 6"  (1.676 m)    Weight 187 lb 1.6 oz (84.9 kg)    BMI (Calculated) 30.21    Single Leg Stand 30 seconds              Nutrition Therapy Plan and Nutrition Goals:  Nutrition Therapy & Goals - 04/28/21 1216       Intervention Plan   Intervention Prescribe, educate and counsel regarding individualized specific dietary modifications aiming towards targeted core components  such as weight, hypertension, lipid management, diabetes, heart failure and other comorbidities.    Expected Outcomes Short Term Goal: Understand basic principles of dietary content, such as calories, fat, sodium, cholesterol and nutrients.;Long Term Goal: Adherence to prescribed nutrition plan.             Nutrition Assessments:  MEDIFICTS Score Key: ?70 Need to make dietary changes  40-70 Heart Healthy Diet ? 40 Therapeutic Level Cholesterol Diet  Flowsheet Row Cardiac Rehab from 04/28/2021 in [facility] Cardiac and Pulmonary Rehab  Picture Your Plate Total Score on Admission 59      Picture Your Plate Scores: [facility] Unhealthy dietary pattern with much room for improvement. 41-50 Dietary pattern unlikely to meet recommendations for good health and room for improvement. 51-60 More healthful dietary pattern, with some room for improvement.  >60 Healthy dietary pattern, although there may be some specific behaviors that could be improved.    Nutrition Goals Re-Evaluation:   Nutrition Goals Discharge (Final Nutrition Goals Re-Evaluation):   Psychosocial: Target Goals: Acknowledge presence or absence of significant depression and/or stress,  maximize coping skills, provide positive support system. Participant is able to verbalize types and ability to use techniques and skills needed for reducing stress and depression.   Education: Stress, Anxiety, and Depression - Group verbal and visual presentation to define topics covered.  Reviews how body is impacted by stress, anxiety, and depression.  Also discusses healthy ways to reduce stress and to treat/manage anxiety and depression.  Written material given at graduation.   Education: Sleep Hygiene -Provides group verbal and written instruction about how sleep can affect your health.  Define sleep hygiene, discuss sleep cycles and impact of sleep habits. Review good sleep hygiene tips.    Initial Review & Psychosocial Screening:  Initial  Psych Review & Screening - 04/14/21 1040       Initial Review   Current issues with Current Stress Concerns    Source of Stress Concerns Occupation      Family Dynamics   Good Support System? Yes   husband     Barriers   Psychosocial barriers to participate in program There are no identifiable barriers or psychosocial needs.;The patient should benefit from training in stress management and relaxation.      Screening Interventions   Interventions Encouraged to exercise;Provide feedback about the scores to participant;To provide support and resources with identified psychosocial needs    Expected Outcomes Short Term goal: Utilizing psychosocial counselor, staff and physician to assist with identification of specific Stressors or current issues interfering with healing process. Setting desired goal for each stressor or current issue identified.;Long Term Goal: Stressors or current issues are controlled or eliminated.;Short Term goal: Identification and review with participant of any Quality of Life or Depression concerns found by scoring the questionnaire.;Long Term goal: The participant improves quality of Life and PHQ9 Scores as seen by post scores and/or verbalization of changes             Quality of Life Scores:   Quality of Life - 04/28/21 1214       Quality of Life   Select Quality of Life      Quality of Life Scores   Health/Function Pre 10.8 %    Socioeconomic Pre 24.13 %    Psych/Spiritual Pre 14.57 %    Family Pre 26.4 %    GLOBAL Pre 16.83 %            Scores of 19 and below usually indicate a poorer quality of life in these areas.  A difference of  2-3 points is a clinically meaningful difference.  A difference of 2-3 points in the total score of the Quality of Life Index has been associated with significant improvement in overall quality of life, self-image, physical symptoms, and general health in studies assessing change in quality of life.  PHQ-9: Recent  Review Flowsheet Data     Depression screen [facility] 2/9 04/28/2021   Decreased Interest 2   Down, Depressed, Hopeless 1   PHQ - 2 Score 3   Altered sleeping 0   Tired, decreased energy 1   Change in appetite 1   Feeling bad or failure about yourself  0   Trouble concentrating 0   Moving slowly or fidgety/restless 1   Suicidal thoughts 0   PHQ-9 Score 6   Difficult doing work/chores Somewhat difficult      Interpretation of Total Score  Total Score Depression Severity:  1-4 = Minimal depression, 5-9 = Mild depression, 10-14 = Moderate depression, 15-19 = Moderately severe depression, 20-27 = Severe depression  Psychosocial Evaluation and Intervention:  Psychosocial Evaluation - 04/14/21 1101       Psychosocial Evaluation & Interventions   Interventions Encouraged to exercise with the program and follow exercise prescription;Stress management education;Relaxation education    Comments Nivedita is coming post stent to cardiac rehab. She is currenlty out of work until October so she has notices a huge decrease in her stress level. She states her job is extremely stressful and she doesn't know what it will be like when she goes back. Her husband is very supportive and has been by her side during her cardiac issues and her recent carpal tunnel surgery. She will need further interventions on her hands/wrists. She is motivated to attend cardiac rehab to gain knowledge about her a heart healthy lifestyle and get back to walking.    Expected Outcomes Short: attend cardiac rehab for education and exercise. Long: Develop and maintain positive self care habits.    Continue Psychosocial Services  Follow up required by staff             Psychosocial Re-Evaluation:   Psychosocial Discharge (Final Psychosocial Re-Evaluation):   Vocational Rehabilitation: Provide vocational rehab assistance to qualifying candidates.   Vocational Rehab Evaluation & Intervention:  Vocational Rehab - 04/14/21 1037        Initial Vocational Rehab Evaluation & Intervention   Assessment shows need for Vocational Rehabilitation No             Education: Education Goals: Education classes will be provided on a variety of topics geared toward better understanding of heart health and risk factor modification. Participant will state understanding/return demonstration of topics presented as noted by education test scores.  Learning Barriers/Preferences:  Learning Barriers/Preferences - 04/14/21 1037       Learning Barriers/Preferences   Learning Barriers None    Learning Preferences None             General Cardiac Education Topics:  AED/CPR: - Group verbal and written instruction with the use of models to demonstrate the basic use of the AED with the basic ABC's of resuscitation.   Anatomy and Cardiac Procedures: - Group verbal and visual presentation and models provide information about basic cardiac anatomy and function. Reviews the testing methods done to diagnose heart disease and the outcomes of the test results. Describes the treatment choices: Medical Management, Angioplasty, or Coronary Bypass Surgery for treating various heart conditions including Myocardial Infarction, Angina, Valve Disease, and Cardiac Arrhythmias.  Written material given at graduation.   Medication Safety: - Group verbal and visual instruction to review commonly prescribed medications for heart and lung disease. Reviews the medication, class of the drug, and side effects. Includes the steps to properly store meds and maintain the prescription regimen.  Written material given at graduation. Flowsheet Row Cardiac Rehab from 05/12/2021 in [facility] Cardiac and Pulmonary Rehab  Date 05/05/21  Educator SB  Instruction Review Code 1- Verbalizes Understanding       Intimacy: - Group verbal instruction through game format to discuss how heart and lung disease can affect sexual intimacy. Written material given at  graduation..   Know Your Numbers and Heart Failure: - Group verbal and visual instruction to discuss disease risk factors for cardiac and pulmonary disease and treatment options.  Reviews associated critical values for Overweight/Obesity, Hypertension, Cholesterol, and Diabetes.  Discusses basics of heart failure: signs/symptoms and treatments.  Introduces Heart Failure Zone chart for action plan for heart failure.  Written material given at graduation. Flowsheet Row Cardiac Rehab  from 05/12/2021 in [facility] Cardiac and Pulmonary Rehab  Education need identified 04/28/21       Infection Prevention: - Provides verbal and written material to individual with discussion of infection control including proper hand washing and proper equipment cleaning during exercise session. Flowsheet Row Cardiac Rehab from 05/12/2021 in [facility] Cardiac and Pulmonary Rehab  Education need identified 04/28/21  Date 04/28/21  Educator KL  Instruction Review Code 1- Verbalizes Understanding       Falls Prevention: - Provides verbal and written material to individual with discussion of falls prevention and safety. Flowsheet Row Cardiac Rehab from 05/12/2021 in [facility] Cardiac and Pulmonary Rehab  Education need identified 04/28/21  Date 04/28/21  Educator KL  Instruction Review Code 1- Verbalizes Understanding       Other: -Provides group and verbal instruction on various topics (see comments)   Knowledge Questionnaire Score:  Knowledge Questionnaire Score - 04/28/21 1215       Knowledge Questionnaire Score   Pre Score 24/26: HF, Nutrition             Core Components/Risk Factors/Patient Goals at Admission:  Personal Goals and Risk Factors at Admission - 04/28/21 1310       Core Components/Risk Factors/Patient Goals on Admission    Weight Management Yes;Weight Loss    Intervention Weight Management: Develop a combined nutrition and exercise program designed to reach desired caloric intake, while  maintaining appropriate intake of nutrient and fiber, sodium and fats, and appropriate energy expenditure required for the weight goal.;Weight Management: Provide education and appropriate resources to help participant work on and attain dietary goals.;Weight Management/Obesity: Establish reasonable short term and long term weight goals.    Admit Weight 187 lb (84.8 kg)    Goal Weight: Short Term 182 lb (82.6 kg)    Goal Weight: Long Term 175 lb (79.4 kg)    Expected Outcomes Short Term: Continue to assess and modify interventions until short term weight is achieved;Weight Loss: Understanding of general recommendations for a balanced deficit meal plan, which promotes 1-2 lb weight loss per week and includes a negative energy balance of [phone] kcal/d;Understanding recommendations for meals to include 15-35% energy as protein, 25-35% energy from fat, 35-60% energy from carbohydrates, less than  of dietary cholesterol, 20-35 gm of total fiber daily;Understanding of distribution of calorie intake throughout the day with the consumption of 4-5 meals/snacks    Diabetes Yes    Intervention Provide education about signs/symptoms and action to take for hypo/hyperglycemia.;Provide education about proper nutrition, including hydration, and aerobic/resistive exercise prescription along with prescribed medications to achieve blood glucose in normal ranges: Fasting glucose 65-99 mg/dL    Expected Outcomes Long Term: Attainment of HbA1C < 7%.;Short Term: Participant verbalizes understanding of the signs/symptoms and immediate care of hyper/hypoglycemia, proper foot care and importance of medication, aerobic/resistive exercise and nutrition plan for blood glucose control.    Hypertension Yes    Intervention Provide education on lifestyle modifcations including regular physical activity/exercise, weight management, moderate sodium restriction and increased consumption of fresh fruit, vegetables, and low fat dairy,  alcohol moderation, and smoking cessation.;Monitor prescription use compliance.    Expected Outcomes Short Term: Continued assessment and intervention until BP is < 140/36mm HG in hypertensive participants. < 130/51mm HG in hypertensive participants with diabetes, heart failure or chronic kidney disease.;Long Term: Maintenance of blood pressure at goal levels.    Lipids Yes    Intervention Provide education and support for participant on nutrition & aerobic/resistive exercise along with prescribed medications  to achieve LDL 70mg , HDL >40mg .    Expected Outcomes Short Term: Participant states understanding of desired cholesterol values and is compliant with medications prescribed. Participant is following exercise prescription and nutrition guidelines.;Long Term: Cholesterol controlled with medications as prescribed, with individualized exercise RX and with personalized nutrition plan. Value goals: LDL < , HDL > 40 mg.             Education:Diabetes - Individual verbal and written instruction to review signs/symptoms of diabetes, desired ranges of glucose level fasting, after meals and with exercise. Acknowledge that pre and post exercise glucose checks will be done for 3 sessions at entry of program. Flowsheet Row Cardiac Rehab from 05/12/2021 in [facility] Cardiac and Pulmonary Rehab  Education need identified 04/28/21  Date 04/28/21  Educator KL  Instruction Review Code 1- Verbalizes Understanding       Core Components/Risk Factors/Patient Goals Review:    Core Components/Risk Factors/Patient Goals at Discharge (Final Review):    ITP Comments:  ITP Comments     Row Name 04/14/21 1035 04/28/21 1235 05/03/21 0959 05/26/21 0935     ITP Comments Initial telephone orientation completed. Diagnosis can be found in [facility] 8/4. EP orientation scheduled Wednesday 9/7 at 11am. Completed and gym orientation. Initial ITP created and sent for review to Dr. Bethann Punches, Medical Director. First  full day of exercise!  Patient was oriented to gym and equipment including functions, settings, policies, and procedures.  Patient's individual exercise prescription and treatment plan were reviewed.  All starting workloads were established based on the results of the 6 minute walk test done at initial orientation visit.  The plan for exercise progression was also introduced and progression will be customized based on patient's performance and goals. 30 day review completed. ITP sent to Dr. Bethann Punches, Medical Director of Cardiac Rehab. Continue with ITP unless changes are made by physician.             Comments: 30 day review

## 2021-05-28 ENCOUNTER — Other Ambulatory Visit: Payer: Self-pay

## 2021-05-28 ENCOUNTER — Encounter: Payer: No Typology Code available for payment source | Attending: Cardiology | Admitting: *Deleted

## 2021-05-28 DIAGNOSIS — Z955 Presence of coronary angioplasty implant and graft: Secondary | ICD-10-CM | POA: Insufficient documentation

## 2021-05-28 NOTE — Progress Notes (Signed)
Daily Session Note  Patient Details  Name: Nicole Singh MRN: [id number] Date of Birth: 05/21/1961 Referring Provider:   Flowsheet Row Cardiac Rehab from 04/28/2021 in [facility] Cardiac and Pulmonary Rehab  Referring Provider Clovis Pu MD       Encounter Date: 05/28/2021  Check In:  Session Check In - 05/28/21 0915       Check-In   Supervising physician immediately available to respond to emergencies See telemetry face sheet for immediately available ER MD    Location [facility]-Cardiac & Pulmonary Rehab    Staff Present Renita Papa, RN BSN;Joseph [place], RCP,RRT,BSRT;Jessica [place], [place], RCEP, CCRP, CCET    Virtual Visit No    Medication changes reported     No    Fall or balance concerns reported    No    Warm-up and Cool-down Performed on first and last piece of equipment    Resistance Training Performed Yes    VAD Patient? No    PAD/SET Patient? No      Pain Assessment   Currently in Pain? No/denies                Social History   Tobacco Use  Smoking Status Former  Smokeless Tobacco Never    Goals Met:  Independence with exercise equipment Exercise tolerated well No report of concerns or symptoms today Strength training completed today  Goals Unmet:  Not Applicable  Comments: Pt able to follow exercise prescription today without complaint.  Will continue to monitor for progression.    Dr. Emily Filbert is Medical Director for [place].  Dr. Ottie Glazier is Medical Director for [facility] Pulmonary Rehabilitation.

## 2021-05-31 ENCOUNTER — Other Ambulatory Visit: Payer: Self-pay

## 2021-05-31 DIAGNOSIS — Z955 Presence of coronary angioplasty implant and graft: Secondary | ICD-10-CM | POA: Diagnosis not present

## 2021-05-31 NOTE — Progress Notes (Signed)
Daily Session Note  Patient Details  Name: Nicole Singh MRN: [id number] Date of Birth: 19-Jul-1961 Referring Provider:   Flowsheet Row Cardiac Rehab from 04/28/2021 in [facility] Cardiac and Pulmonary Rehab  Referring Provider Clovis Pu MD       Encounter Date: 05/31/2021  Check In:  Session Check In - 05/31/21 0913       Check-In   Supervising physician immediately available to respond to emergencies See telemetry face sheet for immediately available ER MD    Location [facility]-Cardiac & Pulmonary Rehab    Staff Present Justin Mend, RCP,RRT,BSRT;Shandria Clinch, RN,BC,MSN;Kelly Amedeo Plenty, BS, ACSM CEP, Exercise Physiologist;Jessica Luan Pulling, MA, RCEP, CCRP, CCET    Virtual Visit No    Medication changes reported     No    Fall or balance concerns reported    No    Warm-up and Cool-down Performed on first and last piece of equipment    Resistance Training Performed Yes    VAD Patient? No    PAD/SET Patient? No      Pain Assessment   Currently in Pain? No/denies                Social History   Tobacco Use  Smoking Status Former  Smokeless Tobacco Never    Goals Met:  Independence with exercise equipment Exercise tolerated well No report of concerns or symptoms today Strength training completed today  Goals Unmet:  Not Applicable  Comments: Pt able to follow exercise prescription today without complaint.  Will continue to monitor for progression.    Dr. Emily Filbert is Medical Director for [place].  Dr. Ottie Glazier is Medical Director for [facility] Pulmonary Rehabilitation.

## 2021-06-04 ENCOUNTER — Other Ambulatory Visit: Payer: Self-pay

## 2021-06-04 DIAGNOSIS — Z955 Presence of coronary angioplasty implant and graft: Secondary | ICD-10-CM

## 2021-06-04 NOTE — Progress Notes (Signed)
Daily Session Note  Patient Details  Name: Nicole Singh MRN: [id number] Date of Birth: [birth year] Referring Provider:   Flowsheet Row Cardiac Rehab from 04/28/2021 in [facility] Cardiac and Pulmonary Rehab  Referring Provider Clovis Pu MD       Encounter Date: 06/04/2021  Check In:  Session Check In - 06/04/21 0927       Check-In   Supervising physician immediately available to respond to emergencies See telemetry face sheet for immediately available ER MD    Location [facility]-Cardiac & Pulmonary Rehab    Staff Present Birdie Sons, MPA, RN;Jessica Luan Pulling, MA, RCEP, CCRP, CCET;Joseph [place], [place]    Virtual Visit No    Medication changes reported     No    Fall or balance concerns reported    No    Warm-up and Cool-down Performed on first and last piece of equipment    Resistance Training Performed Yes    VAD Patient? No    PAD/SET Patient? No      Pain Assessment   Currently in Pain? No/denies                Social History   Tobacco Use  Smoking Status Former  Smokeless Tobacco Never    Goals Met:  Independence with exercise equipment Exercise tolerated well No report of concerns or symptoms today Strength training completed today  Goals Unmet:  Not Applicable  Comments: Pt able to follow exercise prescription today without complaint.  Will continue to monitor for progression.    Dr. Emily Filbert is Medical Director for [place].  Dr. Ottie Glazier is Medical Director for [facility] Pulmonary Rehabilitation.

## 2021-06-07 ENCOUNTER — Encounter: Payer: No Typology Code available for payment source | Admitting: *Deleted

## 2021-06-07 ENCOUNTER — Other Ambulatory Visit: Payer: Self-pay

## 2021-06-07 DIAGNOSIS — Z955 Presence of coronary angioplasty implant and graft: Secondary | ICD-10-CM

## 2021-06-07 NOTE — Progress Notes (Signed)
Daily Session Note  Patient Details  Name: Nicole Singh MRN: [id number] Date of Birth: 01-01-1961 Referring Provider:   Flowsheet Row Cardiac Rehab from 04/28/2021 in [facility] Cardiac and Pulmonary Rehab  Referring Provider Clovis Pu MD       Encounter Date: 06/07/2021  Check In:  Session Check In - 06/07/21 0934       Check-In   Supervising physician immediately available to respond to emergencies See telemetry face sheet for immediately available ER MD    Location [facility]-Cardiac & Pulmonary Rehab    Staff Present Heath Lark, RN, BSN, Laveda Norman, BS, ACSM CEP, Exercise Physiologist;Amanda Oletta Darter, [place], ACSM CEP, Exercise Physiologist    Virtual Visit No    Medication changes reported     No    Fall or balance concerns reported    No    Warm-up and Cool-down Performed on first and last piece of equipment    Resistance Training Performed Yes    VAD Patient? No    PAD/SET Patient? No      Pain Assessment   Currently in Pain? No/denies                Social History   Tobacco Use  Smoking Status Former  Smokeless Tobacco Never    Goals Met:  Independence with exercise equipment Exercise tolerated well No report of concerns or symptoms today  Goals Unmet:  Not Applicable  Comments: Pt able to follow exercise prescription today without complaint.  Will continue to monitor for progression.    Dr. Emily Filbert is Medical Director for [place].  Dr. Ottie Glazier is Medical Director for [facility] Pulmonary Rehabilitation.

## 2021-06-14 ENCOUNTER — Telehealth: Payer: Self-pay

## 2021-06-14 NOTE — Progress Notes (Signed)
Nicole Singh is having a nuclear stress test Friday, then wearing a monitor and having a cath to determine why she is having continued chest pain.  She will not attend for the next 2 weeks and will let us know when she can return.

## 2021-06-14 NOTE — Telephone Encounter (Signed)
Nicole Singh is having a nuclear stress test Friday, then wearing a monitor and having a cath to determine why she is having continued chest pain.  She will not attend for the next 2 weeks and will let us know when she can return. 

## 2021-06-23 ENCOUNTER — Encounter: Payer: Self-pay | Admitting: *Deleted

## 2021-06-23 DIAGNOSIS — Z955 Presence of coronary angioplasty implant and graft: Secondary | ICD-10-CM

## 2021-06-23 NOTE — Progress Notes (Signed)
Cardiac Individual Treatment Plan  Patient Details  Name: Nicole Singh MRN: [id number] Date of Birth: [birth year] Referring Provider:   Flowsheet Row Cardiac Rehab from 04/28/2021 in [facility] Cardiac and Pulmonary Rehab  Referring Provider Vista Lawman MD       Initial Encounter Date:  Flowsheet Row Cardiac Rehab from 04/28/2021 in [facility] Cardiac and Pulmonary Rehab  Date 04/28/21       Visit Diagnosis: Status post coronary artery stent placement  Patient's Home Medications on Admission:  Current Outpatient Medications:    albuterol (VENTOLIN HFA) 108 (90 Base) MCG/ACT inhaler, Inhale 1-2 puffs into the lungs every 6 (six) hours as needed for wheezing or shortness of breath. (Patient not taking: Reported on 04/14/2021), Disp: 18 g, Rfl: 0   amitriptyline (ELAVIL) 25 MG tablet, TAKE 1 TABLET BY MOUTH EVERY DAY EVERY NIGHT, Disp: , Rfl:    aspirin EC 81 MG tablet, Take 81 mg by mouth daily., Disp: , Rfl:    benzonatate (TESSALON) 100 MG capsule, Take 2 capsules (200 mg total) by mouth 3 (three) times daily as needed for cough. (Patient not taking: Reported on 04/14/2021), Disp: 21 capsule, Rfl: 0   Biotin 5000 MCG SUBL, Place under the tongue., Disp: , Rfl:    carvedilol (COREG) 6.25 MG tablet, , Disp: , Rfl:    clindamycin (CLEOCIN T) 1 % external solution, APPLY TO AFFECTED AREA TWICE A DAY (Patient not taking: Reported on 04/14/2021), Disp: , Rfl:    clopidogrel (PLAVIX) 75 MG tablet, Take by mouth., Disp: , Rfl:    cyclobenzaprine (FLEXERIL) 10 MG tablet, Take 1 tablet (10 mg total) by mouth 2 (two) times daily as needed for muscle spasms., Disp: 20 tablet, Rfl: 0   Dulaglutide 1.5 MG/0.5ML SOPN, Inject into the skin., Disp: , Rfl:    furosemide (LASIX) 20 MG tablet, Take by mouth., Disp: , Rfl:    HYDROcodone-acetaminophen (NORCO/VICODIN) 5-325 MG tablet, Take 2 tablets by mouth every 4 (four) hours as needed. (Patient not taking: Reported on 04/14/2021), Disp: 6 tablet, Rfl: 0   insulin  glargine (LANTUS) 100 UNIT/ML injection, Inject into the skin daily., Disp: , Rfl:    Insulin Pen Needle (GLOBAL EASY GLIDE PEN NEEDLES) 32G X 4 MM MISC, Use 1 each nightly, Disp: , Rfl:    losartan (COZAAR) 50 MG tablet, Take by mouth., Disp: , Rfl:    metFORMIN (GLUCOPHAGE) 1000 MG tablet, Take 1,000 mg by mouth 2 (two) times daily with a meal., Disp: , Rfl:    naproxen (NAPROSYN) 500 MG tablet, Take 1 tablet (500 mg total) by mouth 2 (two) times daily as needed for moderate pain. (Patient not taking: Reported on 04/14/2021), Disp: 30 tablet, Rfl: 0   nitroGLYCERIN (NITROSTAT) 0.4 MG SL tablet, Place under the tongue., Disp: , Rfl:    Omega-3 Fatty Acids (FISH OIL) 1000 MG CAPS, Take 2 capsules by mouth 2 (two) times daily., Disp: , Rfl:    pantoprazole (PROTONIX) 40 MG tablet, Take 40 mg by mouth daily., Disp: , Rfl:    predniSONE (STERAPRED UNI-PAK 21 TAB) 10 MG (21) TBPK tablet, Take by mouth daily. Take 6 tabs by mouth daily  for 2 days, then 5 tabs for 2 days, then 4 tabs for 2 days, then 3 tabs for 2 days, 2 tabs for 2 days, then 1 tab by mouth daily for 2 days (Patient not taking: Reported on 04/14/2021), Disp: 42 tablet, Rfl: 0   REPATHA SURECLICK 140 MG/ML SOAJ, Inject into the skin.,  Disp: , Rfl:    spironolactone (ALDACTONE) 25 MG tablet, Take by mouth. (Patient not taking: Reported on 04/14/2021), Disp: , Rfl:    traZODone (DESYREL) 150 MG tablet, TAKE 1 TABLET (150 MG TOTAL) BY MOUTH NIGHTLY AS NEEDED FOR SLEEP., Disp: , Rfl:    valACYclovir (VALTREX) 1000 MG tablet, Take 1 tablet (1,000 mg total) by mouth 3 (three) times daily. (Patient not taking: Reported on 04/14/2021), Disp: 21 tablet, Rfl: 0   valACYclovir (VALTREX) 1000 MG tablet, Take by mouth., Disp: , Rfl:   Past Medical History: Past Medical History:  Diagnosis Date   Acid reflux    COVID-19 08/21/2019   diagnosed in urgent care on 08/21/2019   Diabetes [facility])    MI (myocardial infarction) (HCC)    Shingles 03/10/2019     Tobacco Use: Social History   Tobacco Use  Smoking Status Former  Smokeless Tobacco Never    Labs: Recent Review Flowsheet Data   There is no flowsheet data to display.      Exercise Target Goals: Exercise Program Goal: Individual exercise prescription set using results from initial 6 min walk test and THRR while considering  patient's activity barriers and safety.   Exercise Prescription Goal: Initial exercise prescription builds to 30-45 minutes a day of aerobic activity, 2-3 days per week.  Home exercise guidelines will be given to patient during program as part of exercise prescription that the participant will acknowledge.   Education: Aerobic Exercise: - Group verbal and visual presentation on the components of exercise prescription. Introduces F.I.T.T principle from ACSM for exercise prescriptions.  Reviews F.I.T.T. principles of aerobic exercise including progression. Written material given at graduation.   Education: Resistance Exercise: - Group verbal and visual presentation on the components of exercise prescription. Introduces F.I.T.T principle from ACSM for exercise prescriptions  Reviews F.I.T.T. principles of resistance exercise including progression. Written material given at graduation.    Education: Exercise & Equipment Safety: - Individual verbal instruction and demonstration of equipment use and safety with use of the equipment. Flowsheet Row Cardiac Rehab from 05/12/2021 in [facility] Cardiac and Pulmonary Rehab  Education need identified 04/28/21  Date 04/28/21  Educator KL  Instruction Review Code 1- Verbalizes Understanding       Education: Exercise Physiology & General Exercise Guidelines: - Group verbal and written instruction with models to review the exercise physiology of the cardiovascular system and associated critical values. Provides general exercise guidelines with specific guidelines to those with heart or lung disease.    Education:  Flexibility, Balance, Mind/Body Relaxation: - Group verbal and visual presentation with interactive activity on the components of exercise prescription. Introduces F.I.T.T principle from ACSM for exercise prescriptions. Reviews F.I.T.T. principles of flexibility and balance exercise training including progression. Also discusses the mind body connection.  Reviews various relaxation techniques to help reduce and manage stress (i.e. Deep breathing, progressive muscle relaxation, and visualization). Balance handout provided to take home. Written material given at graduation.   Activity Barriers & Risk Stratification:  Activity Barriers & Cardiac Risk Stratification - 04/28/21 1213       Activity Barriers & Cardiac Risk Stratification   Activity Barriers Other (comment);Joint Problems;Shortness of Breath;Chest Pain/Angina    Comments carpal tunnel surgery 02/12/21    Cardiac Risk Stratification High             6 Minute Walk:  6 Minute Walk     Row Name 04/28/21 1228         6 Minute Walk  Phase Initial     Distance 1165 feet     Walk Time 5 minutes     # of Rest Breaks 2  1:38-2:11; 4:02-4:32     MPH 2.64     METS 3.24     RPE 13     Perceived Dyspnea  2     VO2 Peak 11.35     Symptoms Yes (comment)     Comments Chest tightness 3/10- relieved with rest, SOB     Resting HR 84 bpm     Resting BP 128/68     Resting Oxygen Saturation  98 %     Exercise Oxygen Saturation  during 6 min walk 97 %     Max Ex. HR 106 bpm     Max Ex. BP 136/70     2 Minute Post BP 124/66              Oxygen Initial Assessment:   Oxygen Re-Evaluation:   Oxygen Discharge (Final Oxygen Re-Evaluation):   Initial Exercise Prescription:  Initial Exercise Prescription - 04/28/21 1300       Date of Initial Exercise RX and Referring Provider   Date 04/28/21    Referring Provider Vista Lawman MD      Treadmill   MPH 2.4    Grade 0    Minutes 15    METs 2.84      Recumbant Bike    Level 2    RPM 60    Watts 20    Minutes 15    METs 3.2      NuStep   Level 2    SPM 80    Minutes 15    METs 3.2      REL-XR   Level 2    Speed 50    Minutes 15    METs 3.2      T5 Nustep   Level 1    SPM 80    Minutes 15    METs 3.2      Biostep-RELP   Level 1    SPM 50    Minutes 15    METs 3.2      Prescription Details   Frequency (times per week) 3    Duration Progress to 30 minutes of continuous aerobic without signs/symptoms of physical distress      Intensity   THRR 40-80% of Max Heartrate 115-146    Ratings of Perceived Exertion 11-13    Perceived Dyspnea 0-4      Progression   Progression Continue to progress workloads to maintain intensity without signs/symptoms of physical distress.      Resistance Training   Training Prescription Yes    Weight 3 lb    Reps 10-15             Perform Capillary Blood Glucose checks as needed.  Exercise Prescription Changes:   Exercise Prescription Changes     Row Name 04/28/21 1300 05/03/21 1100 05/19/21 1400 05/31/21 1400 06/16/21 1600     Response to Exercise   Blood Pressure (Admit) 128/68 124/70 118/66 132/68 124/62   Blood Pressure (Exercise) 136/70 130/70 118/56 136/82 142/70   Blood Pressure (Exit) 124/66 104/68 122/68 110/62 128/68   Heart Rate (Admit) 84 bpm 96 bpm 90 bpm 95 bpm 100 bpm   Heart Rate (Exercise) 106 bpm 109 bpm 107 bpm 111 bpm 128 bpm   Heart Rate (Exit) 81 bpm 99 bpm 91 bpm 83 bpm 98 bpm   Oxygen  Saturation (Admit) 98 % -- -- -- --   Oxygen Saturation (Exercise) 97 % -- -- -- --   Oxygen Saturation (Exit) 97 % -- -- -- --   Rating of Perceived Exertion (Exercise) 13 15 15 13 15    Perceived Dyspnea (Exercise) 2 -- -- -- --   Symptoms chest tightness 3/10- relieved with rest; SOB none chest pain4/10 ongoing chest pain ongoing chest pain   Comments walk test results first full day of exercise -- -- --   Duration -- Progress to 30 minutes of  aerobic without signs/symptoms of  physical distress Progress to 30 minutes of  aerobic without signs/symptoms of physical distress Continue with 30 min of aerobic exercise without signs/symptoms of physical distress. Continue with 30 min of aerobic exercise without signs/symptoms of physical distress.   Intensity -- THRR unchanged THRR unchanged THRR unchanged THRR unchanged     Progression   Progression -- Continue to progress workloads to maintain intensity without signs/symptoms of physical distress. Continue to progress workloads to maintain intensity without signs/symptoms of physical distress. Continue to progress workloads to maintain intensity without signs/symptoms of physical distress. Continue to progress workloads to maintain intensity without signs/symptoms of physical distress.   Average METs -- 2.84 2.4 2.91 3     Resistance Training   Training Prescription -- Yes Yes Yes Yes   Weight -- 3 lb 3 lb 3 lb 3 lb   Reps -- 10-15 10-15 10-15 10-15     Interval Training   Interval Training -- -- -- No No     Treadmill   MPH -- 2.4 2.4 2.4 2.6   Grade -- 0 0 0 0   Minutes -- 15 15 15 15    METs -- 2.84 2.84 2.84 2.99     Recumbant Bike   Level -- -- -- 2 --   Minutes -- -- -- 15 --   METs -- -- -- 2.5 --     NuStep   Level -- -- -- 5 --   Minutes -- -- -- 15 --   METs -- -- -- 2.6 --     REL-XR   Level -- -- -- 2 --   Minutes -- -- -- 15 --   METs -- -- -- 3.7 --     T5 Nustep   Level -- 1 1 -- --   Minutes -- 15 15 -- --   METs -- -- 1.9 -- --            Exercise Comments:   Exercise Comments     Row Name 05/03/21 1000           Exercise Comments First full day of exercise!  Patient was oriented to gym and equipment including functions, settings, policies, and procedures.  Patient's individual exercise prescription and treatment plan were reviewed.  All starting workloads were established based on the results of the 6 minute walk test done at initial orientation visit.  The plan for  exercise progression was also introduced and progression will be customized based on patient's performance and goals.                Exercise Goals and Review:   Exercise Goals     Row Name 04/28/21 1235             Exercise Goals   Increase Physical Activity Yes       Intervention Provide advice, education, support and counseling about physical activity/exercise needs.;Develop an individualized exercise  prescription for aerobic and resistive training based on initial evaluation findings, risk stratification, comorbidities and participant's personal goals.       Expected Outcomes Long Term: Add in home exercise to make exercise part of routine and to increase amount of physical activity.;Short Term: Attend rehab on a regular basis to increase amount of physical activity.;Long Term: Exercising regularly at least 3-5 days a week.       Increase Strength and Stamina Yes       Intervention Provide advice, education, support and counseling about physical activity/exercise needs.;Develop an individualized exercise prescription for aerobic and resistive training based on initial evaluation findings, risk stratification, comorbidities and participant's personal goals.       Expected Outcomes Short Term: Increase workloads from initial exercise prescription for resistance, speed, and METs.;Short Term: Perform resistance training exercises routinely during rehab and add in resistance training at home;Long Term: Improve cardiorespiratory fitness, muscular endurance and strength as measured by increased METs and functional capacity ( )       Able to understand and use rate of perceived exertion (RPE) scale Yes       Intervention Provide education and explanation on how to use RPE scale       Expected Outcomes Short Term: Able to use RPE daily in rehab to express subjective intensity level;Long Term:  Able to use RPE to guide intensity level when exercising independently       Able to understand  and use Dyspnea scale Yes       Intervention Provide education and explanation on how to use Dyspnea scale       Expected Outcomes Short Term: Able to use Dyspnea scale daily in rehab to express subjective sense of shortness of breath during exertion;Long Term: Able to use Dyspnea scale to guide intensity level when exercising independently       Knowledge and understanding of Target Heart Rate Range (THRR) Yes       Intervention Provide education and explanation of THRR including how the numbers were predicted and where they are located for reference       Expected Outcomes Short Term: Able to state/look up THRR;Long Term: Able to use THRR to govern intensity when exercising independently;Short Term: Able to use daily as guideline for intensity in rehab       Able to check pulse independently Yes       Intervention Provide education and demonstration on how to check pulse in carotid and radial arteries.;Review the importance of being able to check your own pulse for safety during independent exercise       Expected Outcomes Long Term: Able to check pulse independently and accurately;Short Term: Able to explain why pulse checking is important during independent exercise       Understanding of Exercise Prescription Yes       Intervention Provide education, explanation, and written materials on patient's individual exercise prescription       Expected Outcomes Short Term: Able to explain program exercise prescription;Long Term: Able to explain home exercise prescription to exercise independently                Exercise Goals Re-Evaluation :  Exercise Goals Re-Evaluation     Row Name 05/03/21 1000 05/19/21 1457 05/31/21 1416 06/16/21 1607       Exercise Goal Re-Evaluation   Exercise Goals Review Able to understand and use rate of perceived exertion (RPE) scale;Able to understand and use Dyspnea scale;Knowledge and understanding of Target Heart Rate Range (THRR);Understanding of  Exercise  Prescription Increase Physical Activity;Increase Strength and Stamina Increase Physical Activity;Increase Strength and Stamina;Understanding of Exercise Prescription Increase Physical Activity;Increase Strength and Stamina    Comments Reviewed RPE and dyspnea scales, THR and program prescription with pt today.  Pt voiced understanding and was given a copy of goals to take home. Deshawn has had some chest pain during exercise that resolves with rest.  She saw her cardiologist last Friday.  She is out on vacation this week.  Staff will follow up when she returns. Porshea is doing well in rehab.  She continues to have intermitten chest pain with unknown etiology and her physcian is aware.  She is on level 5 on the NuStep. We will continue to monitor her progress. Avari has been out for a week and may miss more sessions.  She is having further testing for ongoing chest pain    Expected Outcomes Short: Use RPE daily to regulate intensity. Long: Follow program prescription in THR. Short : attend consistently Long: build overall stamina Short: Try 4 lb weights Long: Continue to improve chest pain Short: follow up with Dr appointments Long:  get back to [facility]             Discharge Exercise Prescription (Final Exercise Prescription Changes):  Exercise Prescription Changes - 06/16/21 1600       Response to Exercise   Blood Pressure (Admit) 124/62    Blood Pressure (Exercise) 142/70    Blood Pressure (Exit) 128/68    Heart Rate (Admit) 100 bpm    Heart Rate (Exercise) 128 bpm    Heart Rate (Exit) 98 bpm    Rating of Perceived Exertion (Exercise) 15    Symptoms ongoing chest pain    Duration Continue with 30 min of aerobic exercise without signs/symptoms of physical distress.    Intensity THRR unchanged      Progression   Progression Continue to progress workloads to maintain intensity without signs/symptoms of physical distress.    Average METs 3      Resistance Training   Training Prescription  Yes    Weight 3 lb    Reps 10-15      Interval Training   Interval Training No      Treadmill   MPH 2.6    Grade 0    Minutes 15    METs 2.99             Nutrition:  Target Goals: Understanding of nutrition guidelines, daily intake of sodium 1500mg , cholesterol 200mg , calories 30% from fat and 7% or less from saturated fats, daily to have 5 or more servings of fruits and vegetables.  Education: All About Nutrition: -Group instruction provided by verbal, written material, interactive activities, discussions, models, and posters to present general guidelines for heart healthy nutrition including fat, fiber, MyPlate, the role of sodium in heart healthy nutrition, utilization of the nutrition label, and utilization of this knowledge for meal planning. Follow up email sent as well. Written material given at graduation. Flowsheet Row Cardiac Rehab from 05/12/2021 in [facility] Cardiac and Pulmonary Rehab  Education need identified 04/28/21  Date 05/12/21  Educator MC  Instruction Review Code 1- Verbalizes Understanding       Biometrics:  Pre Biometrics - 04/28/21 1219       Pre Biometrics   Height  (1.676 m)    Weight 187 lb 1.6 oz (84.9 kg)    BMI (Calculated) 30.21    Single Leg Stand 30 seconds  Nutrition Therapy Plan and Nutrition Goals:  Nutrition Therapy & Goals - 04/28/21 1216       Intervention Plan   Intervention Prescribe, educate and counsel regarding individualized specific dietary modifications aiming towards targeted core components such as weight, hypertension, lipid management, diabetes, heart failure and other comorbidities.    Expected Outcomes Short Term Goal: Understand basic principles of dietary content, such as calories, fat, sodium, cholesterol and nutrients.;Long Term Goal: Adherence to prescribed nutrition plan.             Nutrition Assessments:  MEDIFICTS Score Key: ?70 Need to make dietary changes  40-70 Heart  Healthy Diet ? 40 Therapeutic Level Cholesterol Diet  Flowsheet Row Cardiac Rehab from 04/28/2021 in [facility] Cardiac and Pulmonary Rehab  Picture Your Plate Total Score on Admission 59      Picture Your Plate Scores: <09 Unhealthy dietary pattern with much room for improvement. 41-50 Dietary pattern unlikely to meet recommendations for good health and room for improvement. 51-60 More healthful dietary pattern, with some room for improvement.  >60 Healthy dietary pattern, although there may be some specific behaviors that could be improved.    Nutrition Goals Re-Evaluation:   Nutrition Goals Discharge (Final Nutrition Goals Re-Evaluation):   Psychosocial: Target Goals: Acknowledge presence or absence of significant depression and/or stress, maximize coping skills, provide positive support system. Participant is able to verbalize types and ability to use techniques and skills needed for reducing stress and depression.   Education: Stress, Anxiety, and Depression - Group verbal and visual presentation to define topics covered.  Reviews how body is impacted by stress, anxiety, and depression.  Also discusses healthy ways to reduce stress and to treat/manage anxiety and depression.  Written material given at graduation.   Education: Sleep Hygiene -Provides group verbal and written instruction about how sleep can affect your health.  Define sleep hygiene, discuss sleep cycles and impact of sleep habits. Review good sleep hygiene tips.    Initial Review & Psychosocial Screening:  Initial Psych Review & Screening - 04/14/21 1040       Initial Review   Current issues with Current Stress Concerns    Source of Stress Concerns Occupation      Family Dynamics   Good Support System? Yes   husband     Barriers   Psychosocial barriers to participate in program There are no identifiable barriers or psychosocial needs.;The patient should benefit from training in stress management and relaxation.       Screening Interventions   Interventions Encouraged to exercise;Provide feedback about the scores to participant;To provide support and resources with identified psychosocial needs    Expected Outcomes Short Term goal: Utilizing psychosocial counselor, staff and physician to assist with identification of specific Stressors or current issues interfering with healing process. Setting desired goal for each stressor or current issue identified.;Long Term Goal: Stressors or current issues are controlled or eliminated.;Short Term goal: Identification and review with participant of any Quality of Life or Depression concerns found by scoring the questionnaire.;Long Term goal: The participant improves quality of Life and PHQ9 Scores as seen by post scores and/or verbalization of changes             Quality of Life Scores:   Quality of Life - 04/28/21 1214       Quality of Life   Select Quality of Life      Quality of Life Scores   Health/Function Pre 10.8 %    Socioeconomic Pre 24.13 %  Psych/Spiritual Pre 14.57 %    Family Pre 26.4 %    GLOBAL Pre 16.83 %            Scores of 19 and below usually indicate a poorer quality of life in these areas.  A difference of  2-3 points is a clinically meaningful difference.  A difference of 2-3 points in the total score of the Quality of Life Index has been associated with significant improvement in overall quality of life, self-image, physical symptoms, and general health in studies assessing change in quality of life.  PHQ-9: Recent Review Flowsheet Data     Depression screen [facility] 2/9 04/28/2021   Decreased Interest 2   Down, Depressed, Hopeless 1   PHQ - 2 Score 3   Altered sleeping 0   Tired, decreased energy 1   Change in appetite 1   Feeling bad or failure about yourself  0   Trouble concentrating 0   Moving slowly or fidgety/restless 1   Suicidal thoughts 0   PHQ-9 Score 6   Difficult doing work/chores Somewhat difficult       Interpretation of Total Score  Total Score Depression Severity:  1-4 = Minimal depression, 5-9 = Mild depression, 10-14 = Moderate depression, 15-19 = Moderately severe depression, 20-27 = Severe depression   Psychosocial Evaluation and Intervention:  Psychosocial Evaluation - 04/14/21 1101       Psychosocial Evaluation & Interventions   Interventions Encouraged to exercise with the program and follow exercise prescription;Stress management education;Relaxation education    Comments Fayette is coming post stent to cardiac rehab. She is currenlty out of work until October so she has notices a huge decrease in her stress level. She states her job is extremely stressful and she doesn't know what it will be like when she goes back. Her husband is very supportive and has been by her side during her cardiac issues and her recent carpal tunnel surgery. She will need further interventions on her hands/wrists. She is motivated to attend cardiac rehab to gain knowledge about her a heart healthy lifestyle and get back to walking.    Expected Outcomes Short: attend cardiac rehab for education and exercise. Long: Develop and maintain positive self care habits.    Continue Psychosocial Services  Follow up required by staff             Psychosocial Re-Evaluation:   Psychosocial Discharge (Final Psychosocial Re-Evaluation):   Vocational Rehabilitation: Provide vocational rehab assistance to qualifying candidates.   Vocational Rehab Evaluation & Intervention:  Vocational Rehab - 04/14/21 1037       Initial Vocational Rehab Evaluation & Intervention   Assessment shows need for Vocational Rehabilitation No             Education: Education Goals: Education classes will be provided on a variety of topics geared toward better understanding of heart health and risk factor modification. Participant will state understanding/return demonstration of topics presented as noted by education test  scores.  Learning Barriers/Preferences:  Learning Barriers/Preferences - 04/14/21 1037       Learning Barriers/Preferences   Learning Barriers None    Learning Preferences None             General Cardiac Education Topics:  AED/CPR: - Group verbal and written instruction with the use of models to demonstrate the basic use of the AED with the basic ABC's of resuscitation.   Anatomy and Cardiac Procedures: - Group verbal and visual presentation and models provide information about  basic cardiac anatomy and function. Reviews the testing methods done to diagnose heart disease and the outcomes of the test results. Describes the treatment choices: Medical Management, Angioplasty, or Coronary Bypass Surgery for treating various heart conditions including Myocardial Infarction, Angina, Valve Disease, and Cardiac Arrhythmias.  Written material given at graduation.   Medication Safety: - Group verbal and visual instruction to review commonly prescribed medications for heart and lung disease. Reviews the medication, class of the drug, and side effects. Includes the steps to properly store meds and maintain the prescription regimen.  Written material given at graduation. Flowsheet Row Cardiac Rehab from 05/12/2021 in [facility] Card[facility] Re[facility]  Date 05/05/21  Educator SB  Instruction Review Code 1- Verbalizes Understanding       Intimacy: - Group verbal instruction through game format to discuss how heart and lung disease can affect sexual intimacy. Written material given at graduation..   Know Your Numbers and Heart Failure: - Group verbal and visual instruction to discuss disease risk factors for cardiac and pulmonary disease and treatment options.  Reviews associated critical values for Overweight/Obesity, Hypertension, Cholesterol, and Diabetes.  Discusses basics of heart failure: signs/symptoms and treatments.  Introduces Heart Failure Zone chart for action plan for heart  failure.  Written material given at graduation. Flowsheet Row Cardiac Rehab from 05/12/2021 in [facility] Rehab  Education need identified 04/28/21       Infection Prevention: - Provides verbal and written material to individual with discussion of infection control including proper hand washing and proper equipment cleaning during exercise session. Flowsheet Row Cardiac Rehab from 05/12/2021 in [facility] Cardiac and Pulmonary Rehab  Education need identified 04/28/21  Date 04/28/21  Educator KL  Instruction Review Code 1- Verbalizes Understanding       Falls Prevention: - Provides verbal and written material to individual with discussion of falls prevention and safety. Flowsheet Row Cardiac Rehab from 05/12/2021 in [facility] Cardiac and Pulmonary Rehab  Education need identified 04/28/21  Date 04/28/21  Educator KL  Instruction Review Code 1- Verbalizes Understanding       Other: -Provides group and verbal instruction on various topics (see comments)   Knowledge Questionnaire Score:  Knowledge Questionnaire Score - 04/28/21 1215       Knowledge Questionnaire Score   Pre Score 24/26: HF, Nutrition             Core Components/Risk Factors/Patient Goals at Admission:  Personal Goals and Risk Factors at Admission - 04/28/21 1310       Core Components/Risk Factors/Patient Goals on Admission    Weight Management Yes;Weight Loss    Intervention Weight Management: Develop a combined nutrition and exercise program designed to reach desired caloric intake, while maintaining appropriate intake of nutrient and fiber, sodium and fats, and appropriate energy expenditure required for the weight goal.;Weight Management: Provide education and appropriate resources to help participant work on and attain dietary goals.;Weight Management/Obesity: Establish reasonable short term and long term weight goals.    Admit Weight 187 lb (84.8 kg)    Goal Weight: Short Term 182 lb (82.6  kg)    Goal Weight: Long Term 175 lb (79.4 kg)    Expected Outcomes Short Term: Continue to assess and modify interventions until short term weight is achieved;Weight Loss: Understanding of general recommendations for a balanced deficit meal plan, which promotes 1-2 lb weight loss per week and includes a negative energy balance of [phone] kcal/d;Understanding recommendations for meals to include 15-35% energy as protein, 25-35% energy  from fat, 35-60% energy from carbohydrates, less than  of dietary cholesterol, 20-35 gm of total fiber daily;Understanding of distribution of calorie intake throughout the day with the consumption of 4-5 meals/snacks    Diabetes Yes    Intervention Provide education about signs/symptoms and action to take for hypo/hyperglycemia.;Provide education about proper nutrition, including hydration, and aerobic/resistive exercise prescription along with prescribed medications to achieve blood glucose in normal ranges: Fasting glucose 65-99 mg/dL    Expected Outcomes Long Term: Attainment of HbA1C < 7%.;Short Term: Participant verbalizes understanding of the signs/symptoms and immediate care of hyper/hypoglycemia, proper foot care and importance of medication, aerobic/resistive exercise and nutrition plan for blood glucose control.    Hypertension Yes    Intervention Provide education on lifestyle modifcations including regular physical activity/exercise, weight management, moderate sodium restriction and increased consumption of fresh fruit, vegetables, and low fat dairy, alcohol moderation, and smoking cessation.;Monitor prescription use compliance.    Expected Outcomes Short Term: Continued assessment and intervention until BP is < 140/12mm HG in hypertensive participants. < 130/79mm HG in hypertensive participants with diabetes, heart failure or chronic kidney disease.;Long Term: Maintenance of blood pressure at goal levels.    Lipids Yes    Intervention Provide education and  support for participant on nutrition & aerobic/resistive exercise along with prescribed medications to achieve LDL 70mg , HDL >40mg .    Expected Outcomes Short Term: Participant states understanding of desired cholesterol values and is compliant with medications prescribed. Participant is following exercise prescription and nutrition guidelines.;Long Term: Cholesterol controlled with medications as prescribed, with individualized exercise RX and with personalized nutrition plan. Value goals: LDL < , HDL > 40 mg.             Education:Diabetes - Individual verbal and written instruction to review signs/symptoms of diabetes, desired ranges of glucose level fasting, after meals and with exercise. Acknowledge that pre and post exercise glucose checks will be done for 3 sessions at entry of program. Flowsheet Row Cardiac Rehab from 05/12/2021 in [facility] Cardiac and Pulmonary Rehab  Education need identified 04/28/21  Date 04/28/21  Educator KL  Instruction Review Code 1- Verbalizes Understanding       Core Components/Risk Factors/Patient Goals Review:    Core Components/Risk Factors/Patient Goals at Discharge (Final Review):    ITP Comments:  ITP Comments     Row Name 04/14/21 1035 04/28/21 1235 05/03/21 0959 05/26/21 0935 06/14/21 2841   ITP Comments Initial telephone orientation completed. Diagnosis can be found in [facility] 8/4. EP orientation scheduled Wednesday 9/7 at 11am. Completed and gym orientation. Initial ITP created and sent for review to Dr. Bethann Punches, Medical Director. First full day of exercise!  Patient was oriented to gym and equipment including functions, settings, policies, and procedures.  Patient's individual exercise prescription and treatment plan were reviewed.  All starting workloads were established based on the results of the 6 minute walk test done at initial orientation visit.  The plan for exercise progression was also introduced and progression will be  customized based on patient's performance and goals. 30 day review completed. ITP sent to Dr. Bethann Punches, Medical Director of Cardiac Rehab. Continue with ITP unless changes are made by physician. Jyllian is having a nuclear stress test Friday, then wearing a monitor and having a cath to determine why she is having continued chest pain.  She will not attend for the next 2 weeks and will let us know when she can return.    Row Name 06/23/21 [phone]  ITP Comments 30 Day review completed. Medical Director ITP review done, changes made as directed, and signed approval by Medical Director.   Remains out medical reason                Comments:  30 Day review completed. Medical Director ITP review done, changes made as directed, and signed approval by Medical Director.

## 2021-07-05 ENCOUNTER — Encounter: Payer: No Typology Code available for payment source | Attending: Cardiology

## 2021-07-05 DIAGNOSIS — Z955 Presence of coronary angioplasty implant and graft: Secondary | ICD-10-CM | POA: Insufficient documentation

## 2021-07-14 DIAGNOSIS — Z955 Presence of coronary angioplasty implant and graft: Secondary | ICD-10-CM

## 2021-07-14 NOTE — Progress Notes (Signed)
Nicole Singh is still waiting for results and clearance to return.

## 2021-07-21 ENCOUNTER — Encounter: Payer: Self-pay | Admitting: *Deleted

## 2021-07-21 ENCOUNTER — Telehealth: Payer: Self-pay

## 2021-07-21 DIAGNOSIS — Z955 Presence of coronary angioplasty implant and graft: Secondary | ICD-10-CM

## 2021-07-21 NOTE — Progress Notes (Signed)
Cardiac Individual Treatment Plan  Patient Details  Name: Nicole Singh MRN: [id number] Date of Birth: [birth year] Referring Provider:   Flowsheet Row Cardiac Rehab from 04/28/2021 in [facility] Cardiac and Pulmonary Rehab  Referring Provider Vista Lawman MD       Initial Encounter Date:  Flowsheet Row Cardiac Rehab from 04/28/2021 in [facility] Cardiac and Pulmonary Rehab  Date 04/28/21       Visit Diagnosis: Status post coronary artery stent placement  Patient's Home Medications on Admission:  Current Outpatient Medications:    albuterol (VENTOLIN HFA) 108 (90 Base) MCG/ACT inhaler, Inhale 1-2 puffs into the lungs every 6 (six) hours as needed for wheezing or shortness of breath. (Patient not taking: Reported on 04/14/2021), Disp: 18 g, Rfl: 0   amitriptyline (ELAVIL) 25 MG tablet, TAKE 1 TABLET BY MOUTH EVERY DAY EVERY NIGHT, Disp: , Rfl:    aspirin EC 81 MG tablet, Take 81 mg by mouth daily., Disp: , Rfl:    benzonatate (TESSALON) 100 MG capsule, Take 2 capsules (200 mg total) by mouth 3 (three) times daily as needed for cough. (Patient not taking: Reported on 04/14/2021), Disp: 21 capsule, Rfl: 0   Biotin 5000 MCG SUBL, Place under the tongue., Disp: , Rfl:    carvedilol (COREG) 6.25 MG tablet, , Disp: , Rfl:    clindamycin (CLEOCIN T) 1 % external solution, APPLY TO AFFECTED AREA TWICE A DAY (Patient not taking: Reported on 04/14/2021), Disp: , Rfl:    clopidogrel (PLAVIX) 75 MG tablet, Take by mouth., Disp: , Rfl:    cyclobenzaprine (FLEXERIL) 10 MG tablet, Take 1 tablet (10 mg total) by mouth 2 (two) times daily as needed for muscle spasms., Disp: 20 tablet, Rfl: 0   Dulaglutide 1.5 MG/0.5ML SOPN, Inject into the skin., Disp: , Rfl:    furosemide (LASIX) 20 MG tablet, Take by mouth., Disp: , Rfl:    HYDROcodone-acetaminophen (NORCO/VICODIN) 5-325 MG tablet, Take 2 tablets by mouth every 4 (four) hours as needed. (Patient not taking: Reported on 04/14/2021), Disp: 6 tablet, Rfl: 0   insulin  glargine (LANTUS) 100 UNIT/ML injection, Inject into the skin daily., Disp: , Rfl:    Insulin Pen Needle (GLOBAL EASY GLIDE PEN NEEDLES) 32G X 4 MM MISC, Use 1 each nightly, Disp: , Rfl:    losartan (COZAAR) 50 MG tablet, Take by mouth., Disp: , Rfl:    metFORMIN (GLUCOPHAGE) 1000 MG tablet, Take 1,000 mg by mouth 2 (two) times daily with a meal., Disp: , Rfl:    naproxen (NAPROSYN) 500 MG tablet, Take 1 tablet (500 mg total) by mouth 2 (two) times daily as needed for moderate pain. (Patient not taking: Reported on 04/14/2021), Disp: 30 tablet, Rfl: 0   nitroGLYCERIN (NITROSTAT) 0.4 MG SL tablet, Place under the tongue., Disp: , Rfl:    Omega-3 Fatty Acids (FISH OIL) 1000 MG CAPS, Take 2 capsules by mouth 2 (two) times daily., Disp: , Rfl:    pantoprazole (PROTONIX) 40 MG tablet, Take 40 mg by mouth daily., Disp: , Rfl:    predniSONE (STERAPRED UNI-PAK 21 TAB) 10 MG (21) TBPK tablet, Take by mouth daily. Take 6 tabs by mouth daily  for 2 days, then 5 tabs for 2 days, then 4 tabs for 2 days, then 3 tabs for 2 days, 2 tabs for 2 days, then 1 tab by mouth daily for 2 days (Patient not taking: Reported on 04/14/2021), Disp: 42 tablet, Rfl: 0   REPATHA SURECLICK 140 MG/ML SOAJ, Inject into the skin.,  Disp: , Rfl:    spironolactone (ALDACTONE) 25 MG tablet, Take by mouth. (Patient not taking: Reported on 04/14/2021), Disp: , Rfl:    traZODone (DESYREL) 150 MG tablet, TAKE 1 TABLET (150 MG TOTAL) BY MOUTH NIGHTLY AS NEEDED FOR SLEEP., Disp: , Rfl:    valACYclovir (VALTREX) 1000 MG tablet, Take 1 tablet (1,000 mg total) by mouth 3 (three) times daily. (Patient not taking: Reported on 04/14/2021), Disp: 21 tablet, Rfl: 0   valACYclovir (VALTREX) 1000 MG tablet, Take by mouth., Disp: , Rfl:   Past Medical History: Past Medical History:  Diagnosis Date   Acid reflux    COVID-19 08/21/2019   diagnosed in urgent care on 08/21/2019   Diabetes [facility])    MI (myocardial infarction) (HCC)    Shingles 03/10/2019     Tobacco Use: Social History   Tobacco Use  Smoking Status Former  Smokeless Tobacco Never    Labs: Recent Review Flowsheet Data   There is no flowsheet data to display.      Exercise Target Goals: Exercise Program Goal: Individual exercise prescription set using results from initial 6 min walk test and THRR while considering  patient's activity barriers and safety.   Exercise Prescription Goal: Initial exercise prescription builds to 30-45 minutes a day of aerobic activity, 2-3 days per week.  Home exercise guidelines will be given to patient during program as part of exercise prescription that the participant will acknowledge.   Education: Aerobic Exercise: - Group verbal and visual presentation on the components of exercise prescription. Introduces F.I.T.T principle from ACSM for exercise prescriptions.  Reviews F.I.T.T. principles of aerobic exercise including progression. Written material given at graduation.   Education: Resistance Exercise: - Group verbal and visual presentation on the components of exercise prescription. Introduces F.I.T.T principle from ACSM for exercise prescriptions  Reviews F.I.T.T. principles of resistance exercise including progression. Written material given at graduation.    Education: Exercise & Equipment Safety: - Individual verbal instruction and demonstration of equipment use and safety with use of the equipment. Flowsheet Row Cardiac Rehab from 05/12/2021 in [facility] Cardiac and Pulmonary Rehab  Education need identified 04/28/21  Date 04/28/21  Educator KL  Instruction Review Code 1- Verbalizes Understanding       Education: Exercise Physiology & General Exercise Guidelines: - Group verbal and written instruction with models to review the exercise physiology of the cardiovascular system and associated critical values. Provides general exercise guidelines with specific guidelines to those with heart or lung disease.    Education:  Flexibility, Balance, Mind/Body Relaxation: - Group verbal and visual presentation with interactive activity on the components of exercise prescription. Introduces F.I.T.T principle from ACSM for exercise prescriptions. Reviews F.I.T.T. principles of flexibility and balance exercise training including progression. Also discusses the mind body connection.  Reviews various relaxation techniques to help reduce and manage stress (i.e. Deep breathing, progressive muscle relaxation, and visualization). Balance handout provided to take home. Written material given at graduation.   Activity Barriers & Risk Stratification:  Activity Barriers & Cardiac Risk Stratification - 04/28/21 1213       Activity Barriers & Cardiac Risk Stratification   Activity Barriers Other (comment);Joint Problems;Shortness of Breath;Chest Pain/Angina    Comments carpal tunnel surgery 02/12/21    Cardiac Risk Stratification High             6 Minute Walk:  6 Minute Walk     Row Name 04/28/21 1228         6 Minute Walk  Phase Initial     Distance 1165 feet     Walk Time 5 minutes     # of Rest Breaks 2  1:38-2:11; 4:02-4:32     MPH 2.64     METS 3.24     RPE 13     Perceived Dyspnea  2     VO2 Peak 11.35     Symptoms Yes (comment)     Comments Chest tightness 3/10- relieved with rest, SOB     Resting HR 84 bpm     Resting BP 128/68     Resting Oxygen Saturation  98 %     Exercise Oxygen Saturation  during 6 min walk 97 %     Max Ex. HR 106 bpm     Max Ex. BP 136/70     2 Minute Post BP 124/66              Oxygen Initial Assessment:   Oxygen Re-Evaluation:   Oxygen Discharge (Final Oxygen Re-Evaluation):   Initial Exercise Prescription:  Initial Exercise Prescription - 04/28/21 1300       Date of Initial Exercise RX and Referring Provider   Date 04/28/21    Referring Provider Vista Lawman MD      Treadmill   MPH 2.4    Grade 0    Minutes 15    METs 2.84      Recumbant Bike    Level 2    RPM 60    Watts 20    Minutes 15    METs 3.2      NuStep   Level 2    SPM 80    Minutes 15    METs 3.2      REL-XR   Level 2    Speed 50    Minutes 15    METs 3.2      T5 Nustep   Level 1    SPM 80    Minutes 15    METs 3.2      Biostep-RELP   Level 1    SPM 50    Minutes 15    METs 3.2      Prescription Details   Frequency (times per week) 3    Duration Progress to 30 minutes of continuous aerobic without signs/symptoms of physical distress      Intensity   THRR 40-80% of Max Heartrate 115-146    Ratings of Perceived Exertion 11-13    Perceived Dyspnea 0-4      Progression   Progression Continue to progress workloads to maintain intensity without signs/symptoms of physical distress.      Resistance Training   Training Prescription Yes    Weight 3 lb    Reps 10-15             Perform Capillary Blood Glucose checks as needed.  Exercise Prescription Changes:   Exercise Prescription Changes     Row Name 04/28/21 1300 05/03/21 1100 05/19/21 1400 05/31/21 1400 06/16/21 1600     Response to Exercise   Blood Pressure (Admit) 128/68 124/70 118/66 132/68 124/62   Blood Pressure (Exercise) 136/70 130/70 118/56 136/82 142/70   Blood Pressure (Exit) 124/66 104/68 122/68 110/62 128/68   Heart Rate (Admit) 84 bpm 96 bpm 90 bpm 95 bpm 100 bpm   Heart Rate (Exercise) 106 bpm 109 bpm 107 bpm 111 bpm 128 bpm   Heart Rate (Exit) 81 bpm 99 bpm 91 bpm 83 bpm 98 bpm   Oxygen  Saturation (Admit) 98 % -- -- -- --   Oxygen Saturation (Exercise) 97 % -- -- -- --   Oxygen Saturation (Exit) 97 % -- -- -- --   Rating of Perceived Exertion (Exercise) 13 15 15 13 15    Perceived Dyspnea (Exercise) 2 -- -- -- --   Symptoms chest tightness 3/10- relieved with rest; SOB none chest pain4/10 ongoing chest pain ongoing chest pain   Comments walk test results first full day of exercise -- -- --   Duration -- Progress to 30 minutes of  aerobic without signs/symptoms of  physical distress Progress to 30 minutes of  aerobic without signs/symptoms of physical distress Continue with 30 min of aerobic exercise without signs/symptoms of physical distress. Continue with 30 min of aerobic exercise without signs/symptoms of physical distress.   Intensity -- THRR unchanged THRR unchanged THRR unchanged THRR unchanged     Progression   Progression -- Continue to progress workloads to maintain intensity without signs/symptoms of physical distress. Continue to progress workloads to maintain intensity without signs/symptoms of physical distress. Continue to progress workloads to maintain intensity without signs/symptoms of physical distress. Continue to progress workloads to maintain intensity without signs/symptoms of physical distress.   Average METs -- 2.84 2.4 2.91 3     Resistance Training   Training Prescription -- Yes Yes Yes Yes   Weight -- 3 lb 3 lb 3 lb 3 lb   Reps -- 10-15 10-15 10-15 10-15     Interval Training   Interval Training -- -- -- No No     Treadmill   MPH -- 2.4 2.4 2.4 2.6   Grade -- 0 0 0 0   Minutes -- 15 15 15 15    METs -- 2.84 2.84 2.84 2.99     Recumbant Bike   Level -- -- -- 2 --   Minutes -- -- -- 15 --   METs -- -- -- 2.5 --     NuStep   Level -- -- -- 5 --   Minutes -- -- -- 15 --   METs -- -- -- 2.6 --     REL-XR   Level -- -- -- 2 --   Minutes -- -- -- 15 --   METs -- -- -- 3.7 --     T5 Nustep   Level -- 1 1 -- --   Minutes -- 15 15 -- --   METs -- -- 1.9 -- --            Exercise Comments:   Exercise Comments     Row Name 05/03/21 1000           Exercise Comments First full day of exercise!  Patient was oriented to gym and equipment including functions, settings, policies, and procedures.  Patient's individual exercise prescription and treatment plan were reviewed.  All starting workloads were established based on the results of the 6 minute walk test done at initial orientation visit.  The plan for  exercise progression was also introduced and progression will be customized based on patient's performance and goals.                Exercise Goals and Review:   Exercise Goals     Row Name 04/28/21 1235             Exercise Goals   Increase Physical Activity Yes       Intervention Provide advice, education, support and counseling about physical activity/exercise needs.;Develop an individualized exercise  prescription for aerobic and resistive training based on initial evaluation findings, risk stratification, comorbidities and participant's personal goals.       Expected Outcomes Long Term: Add in home exercise to make exercise part of routine and to increase amount of physical activity.;Short Term: Attend rehab on a regular basis to increase amount of physical activity.;Long Term: Exercising regularly at least 3-5 days a week.       Increase Strength and Stamina Yes       Intervention Provide advice, education, support and counseling about physical activity/exercise needs.;Develop an individualized exercise prescription for aerobic and resistive training based on initial evaluation findings, risk stratification, comorbidities and participant's personal goals.       Expected Outcomes Short Term: Increase workloads from initial exercise prescription for resistance, speed, and METs.;Short Term: Perform resistance training exercises routinely during rehab and add in resistance training at home;Long Term: Improve cardiorespiratory fitness, muscular endurance and strength as measured by increased METs and functional capacity ( )       Able to understand and use rate of perceived exertion (RPE) scale Yes       Intervention Provide education and explanation on how to use RPE scale       Expected Outcomes Short Term: Able to use RPE daily in rehab to express subjective intensity level;Long Term:  Able to use RPE to guide intensity level when exercising independently       Able to understand  and use Dyspnea scale Yes       Intervention Provide education and explanation on how to use Dyspnea scale       Expected Outcomes Short Term: Able to use Dyspnea scale daily in rehab to express subjective sense of shortness of breath during exertion;Long Term: Able to use Dyspnea scale to guide intensity level when exercising independently       Knowledge and understanding of Target Heart Rate Range (THRR) Yes       Intervention Provide education and explanation of THRR including how the numbers were predicted and where they are located for reference       Expected Outcomes Short Term: Able to state/look up THRR;Long Term: Able to use THRR to govern intensity when exercising independently;Short Term: Able to use daily as guideline for intensity in rehab       Able to check pulse independently Yes       Intervention Provide education and demonstration on how to check pulse in carotid and radial arteries.;Review the importance of being able to check your own pulse for safety during independent exercise       Expected Outcomes Long Term: Able to check pulse independently and accurately;Short Term: Able to explain why pulse checking is important during independent exercise       Understanding of Exercise Prescription Yes       Intervention Provide education, explanation, and written materials on patient's individual exercise prescription       Expected Outcomes Short Term: Able to explain program exercise prescription;Long Term: Able to explain home exercise prescription to exercise independently                Exercise Goals Re-Evaluation :  Exercise Goals Re-Evaluation     Row Name 05/03/21 1000 05/19/21 1457 05/31/21 1416 06/16/21 1607 06/28/21 1018     Exercise Goal Re-Evaluation   Exercise Goals Review Able to understand and use rate of perceived exertion (RPE) scale;Able to understand and use Dyspnea scale;Knowledge and understanding of Target Heart Rate Range (THRR);Understanding of  Exercise Prescription Increase Physical Activity;Increase Strength and Stamina Increase Physical Activity;Increase Strength and Stamina;Understanding of Exercise Prescription Increase Physical Activity;Increase Strength and Stamina Increase Physical Activity;Increase Strength and Stamina   Comments Reviewed RPE and dyspnea scales, THR and program prescription with pt today.  Pt voiced understanding and was given a copy of goals to take home. Nicole Singh has had some chest pain during exercise that resolves with rest.  She saw her cardiologist last Friday.  She is out on vacation this week.  Staff will follow up when she returns. Nicole Singh is doing well in rehab.  She continues to have intermitten chest pain with unknown etiology and her physcian is aware.  She is on level 5 on the NuStep. We will continue to monitor her progress. Nicole Singh has been out for a week and may miss more sessions.  She is having further testing for ongoing chest pain Nicole Singh still continues to be out of rehab. She is still waiting to hear back on her results from her monitor and her provider would like her to hold off until the results are back and she is cleared.   Expected Outcomes Short: Use RPE daily to regulate intensity. Long: Follow program prescription in THR. Short : attend consistently Long: build overall stamina Short: Try 4 lb weights Long: Continue to improve chest pain Short: follow up with Dr appointments Long:  get back to [facility] Short: Folow up with doctor regarding results Long: Attend cardiac rehab consistently            Discharge Exercise Prescription (Final Exercise Prescription Changes):  Exercise Prescription Changes - 06/16/21 1600       Response to Exercise   Blood Pressure (Admit) 124/62    Blood Pressure (Exercise) 142/70    Blood Pressure (Exit) 128/68    Heart Rate (Admit) 100 bpm    Heart Rate (Exercise) 128 bpm    Heart Rate (Exit) 98 bpm    Rating of Perceived Exertion (Exercise) 15    Symptoms  ongoing chest pain    Duration Continue with 30 min of aerobic exercise without signs/symptoms of physical distress.    Intensity THRR unchanged      Progression   Progression Continue to progress workloads to maintain intensity without signs/symptoms of physical distress.    Average METs 3      Resistance Training   Training Prescription Yes    Weight 3 lb    Reps 10-15      Interval Training   Interval Training No      Treadmill   MPH 2.6    Grade 0    Minutes 15    METs 2.99             Nutrition:  Target Goals: Understanding of nutrition guidelines, daily intake of sodium 1500mg , cholesterol 200mg , calories 30% from fat and 7% or less from saturated fats, daily to have 5 or more servings of fruits and vegetables.  Education: All About Nutrition: -Group instruction provided by verbal, written material, interactive activities, discussions, models, and posters to present general guidelines for heart healthy nutrition including fat, fiber, MyPlate, the role of sodium in heart healthy nutrition, utilization of the nutrition label, and utilization of this knowledge for meal planning. Follow up email sent as well. Written material given at graduation. Flowsheet Row Cardiac Rehab from 05/12/2021 in [facility] Cardiac and Pulmonary Rehab  Education need identified 04/28/21  Date 05/12/21  Educator [facility]  Instruction Review Code 1- [employer]  Biometrics:  Pre Biometrics - 04/28/21 1219       Pre Biometrics   Height 5\' 6"  (1.676 m)    Weight 187 lb 1.6 oz (84.9 kg)    BMI (Calculated) 30.21    Single Leg Stand 30 seconds              Nutrition Therapy Plan and Nutrition Goals:  Nutrition Therapy & Goals - 04/28/21 1216       Intervention Plan   Intervention Prescribe, educate and counsel regarding individualized specific dietary modifications aiming towards targeted core components such as weight, hypertension, lipid management, diabetes, heart  failure and other comorbidities.    Expected Outcomes Short Term Goal: Understand basic principles of dietary content, such as calories, fat, sodium, cholesterol and nutrients.;Long Term Goal: Adherence to prescribed nutrition plan.             Nutrition Assessments:  MEDIFICTS Score Key: ?70 Need to make dietary changes  40-70 Heart Healthy Diet ? 40 Therapeutic Level Cholesterol Diet  Flowsheet Row Cardiac Rehab from 04/28/2021 in [facility] Cardiac and Pulmonary Rehab  Picture Your Plate Total Score on Admission 59      Picture Your Plate Scores: [facility] Unhealthy dietary pattern with much room for improvement. 41-50 Dietary pattern unlikely to meet recommendations for good health and room for improvement. 51-60 More healthful dietary pattern, with some room for improvement.  >60 Healthy dietary pattern, although there may be some specific behaviors that could be improved.    Nutrition Goals Re-Evaluation:   Nutrition Goals Discharge (Final Nutrition Goals Re-Evaluation):   Psychosocial: Target Goals: Acknowledge presence or absence of significant depression and/or stress, maximize coping skills, provide positive support system. Participant is able to verbalize types and ability to use techniques and skills needed for reducing stress and depression.   Education: Stress, Anxiety, and Depression - Group verbal and visual presentation to define topics covered.  Reviews how body is impacted by stress, anxiety, and depression.  Also discusses healthy ways to reduce stress and to treat/manage anxiety and depression.  Written material given at graduation.   Education: Sleep Hygiene -Provides group verbal and written instruction about how sleep can affect your health.  Define sleep hygiene, discuss sleep cycles and impact of sleep habits. Review good sleep hygiene tips.    Initial Review & Psychosocial Screening:  Initial Psych Review & Screening - 04/14/21 1040       Initial Review    Current issues with Current Stress Concerns    Source of Stress Concerns Occupation      Family Dynamics   Good Support System? Yes   husband     Barriers   Psychosocial barriers to participate in program There are no identifiable barriers or psychosocial needs.;The patient should benefit from training in stress management and relaxation.      Screening Interventions   Interventions Encouraged to exercise;Provide feedback about the scores to participant;To provide support and resources with identified psychosocial needs    Expected Outcomes Short Term goal: Utilizing psychosocial counselor, staff and physician to assist with identification of specific Stressors or current issues interfering with healing process. Setting desired goal for each stressor or current issue identified.;Long Term Goal: Stressors or current issues are controlled or eliminated.;Short Term goal: Identification and review with participant of any Quality of Life or Depression concerns found by scoring the questionnaire.;Long Term goal: The participant improves quality of Life and PHQ9 Scores as seen by post scores and/or verbalization of changes  Quality of Life Scores:   Quality of Life - 04/28/21 1214       Quality of Life   Select Quality of Life      Quality of Life Scores   Health/Function Pre 10.8 %    Socioeconomic Pre 24.13 %    Psych/Spiritual Pre 14.57 %    Family Pre 26.4 %    GLOBAL Pre 16.83 %            Scores of 19 and below usually indicate a poorer quality of life in these areas.  A difference of  2-3 points is a clinically meaningful difference.  A difference of 2-3 points in the total score of the Quality of Life Index has been associated with significant improvement in overall quality of life, self-image, physical symptoms, and general health in studies assessing change in quality of life.  PHQ-9: Recent Review Flowsheet Data     Depression screen [facility] 2/9 04/28/2021    Decreased Interest 2   Down, Depressed, Hopeless 1   PHQ - 2 Score 3   Altered sleeping 0   Tired, decreased energy 1   Change in appetite 1   Feeling bad or failure about yourself  0   Trouble concentrating 0   Moving slowly or fidgety/restless 1   Suicidal thoughts 0   PHQ-9 Score 6   Difficult doing work/chores Somewhat difficult      Interpretation of Total Score  Total Score Depression Severity:  1-4 = Minimal depression, 5-9 = Mild depression, 10-14 = Moderate depression, 15-19 = Moderately severe depression, 20-27 = Severe depression   Psychosocial Evaluation and Intervention:  Psychosocial Evaluation - 04/14/21 1101       Psychosocial Evaluation & Interventions   Interventions Encouraged to exercise with the program and follow exercise prescription;Stress management education;Relaxation education    Comments Nicole Singh is coming post stent to cardiac rehab. She is currenlty out of work until October so she has notices a huge decrease in her stress level. She states her job is extremely stressful and she doesn't know what it will be like when she goes back. Her husband is very supportive and has been by her side during her cardiac issues and her recent carpal tunnel surgery. She will need further interventions on her hands/wrists. She is motivated to attend cardiac rehab to gain knowledge about her a heart healthy lifestyle and get back to walking.    Expected Outcomes Short: attend cardiac rehab for education and exercise. Long: Develop and maintain positive self care habits.    Continue Psychosocial Services  Follow up required by staff             Psychosocial Re-Evaluation:   Psychosocial Discharge (Final Psychosocial Re-Evaluation):   Vocational Rehabilitation: Provide vocational rehab assistance to qualifying candidates.   Vocational Rehab Evaluation & Intervention:  Vocational Rehab - 04/14/21 1037       Initial Vocational Rehab Evaluation & Intervention    Assessment shows need for Vocational Rehabilitation No             Education: Education Goals: Education classes will be provided on a variety of topics geared toward better understanding of heart health and risk factor modification. Participant will state understanding/return demonstration of topics presented as noted by education test scores.  Learning Barriers/Preferences:  Learning Barriers/Preferences - 04/14/21 1037       Learning Barriers/Preferences   Learning Barriers None    Learning Preferences None  General Cardiac Education Topics:  AED/CPR: - Group verbal and written instruction with the use of models to demonstrate the basic use of the AED with the basic ABC's of resuscitation.   Anatomy and Cardiac Procedures: - Group verbal and visual presentation and models provide information about basic cardiac anatomy and function. Reviews the testing methods done to diagnose heart disease and the outcomes of the test results. Describes the treatment choices: Medical Management, Angioplasty, or Coronary Bypass Surgery for treating various heart conditions including Myocardial Infarction, Angina, Valve Disease, and Cardiac Arrhythmias.  Written material given at graduation.   Medication Safety: - Group verbal and visual instruction to review commonly prescribed medications for heart and lung disease. Reviews the medication, class of the drug, and side effects. Includes the steps to properly store meds and maintain the prescription regimen.  Written material given at graduation. Flowsheet Row Cardiac Rehab from 05/12/2021 in [facility]  Date 05/05/21  Educator SB  Instruction Review Code 1- Verbalizes Understanding       Intimacy: - Group verbal instruction through game format to discuss how heart and lung disease can affect sexual intimacy. Written material given at graduation..   Know Your Numbers and Heart Failure: - Group verbal  and visual instruction to discuss disease risk factors for cardiac and pulmonary disease and treatment options.  Reviews associated critical values for Overweight/Obesity, Hypertension, Cholesterol, and Diabetes.  Discusses basics of heart failure: signs/symptoms and treatments.  Introduces Heart Failure Zone chart for action plan for heart failure.  Written material given at graduation. Flowsheet Row Cardiac Rehab from 05/12/2021 in [facility] Cardiac and Pul[facility] Rehab  Education need identified 04/28/21       Infection Prevention: - Provides verbal and written material to individual with discussion of infection control including proper hand washing and proper equipment cleaning during exercise session. Flowsheet Row Cardiac Rehab from 05/12/2021 in [facility] Cardiac and Pulmonary Rehab  Education need identified 04/28/21  Date 04/28/21  Educator KL  Instruction Review Code 1- Verbalizes Understanding       Falls Prevention: - Provides verbal and written material to individual with discussion of falls prevention and safety. Flowsheet Row Cardiac Rehab from 05/12/2021 in [facility] Cardiac [facility] Pulmonary Rehab  Education need identified 04/28/21  Date 04/28/21  Educator KL  Instruction Review Code 1- Verbalizes Understanding       Other: -Provides group and verbal instruction on various topics (see comments)   Knowledge Questionnaire Score:  Knowledge Questionnaire Score - 04/28/21 1215       Knowledge Questionnaire Score   Pre Score 24/26: HF, Nutrition             Core Components/Risk Factors/Patient Goals at Admission:  Personal Goals and Risk Factors at Admission - 04/28/21 1310       Core Components/Risk Factors/Patient Goals on Admission    Weight Management Yes;Weight Loss    Intervention Weight Management: Develop a combined nutrition and exercise program designed to reach desired caloric intake, while maintaining appropriate intake of nutrient and fiber, sodium and fats, and  appropriate energy expenditure required for the weight goal.;Weight Management: Provide education and appropriate resources to help participant work on and attain dietary goals.;Weight Management/Obesity: Establish reasonable short term and long term weight goals.    Admit Weight 187 lb (84.8 kg)    Goal Weight: Short Term 182 lb (82.6 kg)    Goal Weight: Long Term 175 lb (79.4 kg)    Expected Outcomes Short Term: Continue to assess  and modify interventions until short term weight is achieved;Weight Loss: Understanding of general recommendations for a balanced deficit meal plan, which promotes 1-2 lb weight loss per week and includes a negative energy balance of [phone] kcal/d;Understanding recommendations for meals to include 15-35% energy as protein, 25-35% energy from fat, 35-60% energy from carbohydrates, less than 200mg  of dietary cholesterol, 20-35 gm of total fiber daily;Understanding of distribution of calorie intake throughout the day with the consumption of 4-5 meals/snacks    Diabetes Yes    Intervention Provide education about signs/symptoms and action to take for hypo/hyperglycemia.;Provide education about proper nutrition, including hydration, and aerobic/resistive exercise prescription along with prescribed medications to achieve blood glucose in normal ranges: Fasting glucose 65-99 mg/dL    Expected Outcomes Long Term: Attainment of HbA1C < 7%.;Short Term: Participant verbalizes understanding of the signs/symptoms and immediate care of hyper/hypoglycemia, proper foot care and importance of medication, aerobic/resistive exercise and nutrition plan for blood glucose control.    Hypertension Yes    Intervention Provide education on lifestyle modifcations including regular physical activity/exercise, weight management, moderate sodium restriction and increased consumption of fresh fruit, vegetables, and low fat dairy, alcohol moderation, and smoking cessation.;Monitor prescription use  compliance.    Expected Outcomes Short Term: Continued assessment and intervention until BP is < 140/80mm HG in hypertensive participants. < 130/58mm HG in hypertensive participants with diabetes, heart failure or chronic kidney disease.;Long Term: Maintenance of blood pressure at goal levels.    Lipids Yes    Intervention Provide education and support for participant on nutrition & aerobic/resistive exercise along with prescribed medications to achieve LDL 70mg , HDL >40mg .    Expected Outcomes Short Term: Participant states understanding of desired cholesterol values and is compliant with medications prescribed. Participant is following exercise prescription and nutrition guidelines.;Long Term: Cholesterol controlled with medications as prescribed, with individualized exercise RX and with personalized nutrition plan. Value goals: LDL < 70mg , HDL > 40 mg.             Education:Diabetes - Individual verbal and written instruction to review signs/symptoms of diabetes, desired ranges of glucose level fasting, after meals and with exercise. Acknowledge that pre and post exercise glucose checks will be done for 3 sessions at entry of program. Flowsheet Row Cardiac Rehab from 05/12/2021 in [facility] Cardiac and Pulmonary Rehab  Education need identified 04/28/21  Date 04/28/21  Educator KL  Instruction Review Code 1- Verbalizes Understanding       Core Components/Risk Factors/Patient Goals Review:    Core Components/Risk Factors/Patient Goals at Discharge (Final Review):    ITP Comments:  ITP Comments     Row Name 04/14/21 1035 04/28/21 1235 05/03/21 0959 05/26/21 0935 06/14/21 07/26/21   ITP Comments Initial telephone orientation completed. Diagnosis can be found in [facility] 8/4. EP orientation scheduled Wednesday 9/7 at 11am. Completed 2423 and gym orientation. Initial ITP created and sent for review to Dr. 04-06-1973, Medical Director. First full day of exercise!  Patient was oriented to gym and  equipment including functions, settings, policies, and procedures.  Patient's individual exercise prescription and treatment plan were reviewed.  All starting workloads were established based on the results of the 6 minute walk test done at initial orientation visit.  The plan for exercise progression was also introduced and progression will be customized based on patient's performance and goals. 30 day review completed. ITP sent to Dr. , Medical Director of Cardiac Rehab. Continue with ITP unless changes are made by physician. Nicole Singh is having a  nuclear stress test Friday, then wearing a monitor and having a cath to determine why she is having continued chest pain.  She will not attend for the next 2 weeks and will let us know when she can return.    Row Name 06/23/21 0736 06/28/21 1024 07/14/21 1620 07/21/21 0722     ITP Comments 30 Day review completed. Medical Director ITP review done, changes made as directed, and signed approval by Medical Director.   Remains out medical reason Nicole Singh still continues to be out of rehab. She is still waiting to hear back on her results from her monitor and her provider would like her to hold off until the results are back and she is cleared. Nicole Singh is still waiting for results and clearance to return. 30 Day review completed. Medical Director ITP review done, changes made as directed, and signed approval by Medical Director.             Comments:

## 2021-07-21 NOTE — Telephone Encounter (Signed)
Nicole Singh is waiting to hear back from Dr about clearance to return - she has emailed Dr to get results from monitor - she will let us know when she is cleared

## 2021-07-23 ENCOUNTER — Encounter: Payer: No Typology Code available for payment source | Attending: Cardiology

## 2021-07-23 DIAGNOSIS — Z955 Presence of coronary angioplasty implant and graft: Secondary | ICD-10-CM | POA: Insufficient documentation

## 2021-07-28 ENCOUNTER — Encounter: Payer: Self-pay | Admitting: *Deleted

## 2021-07-28 DIAGNOSIS — Z955 Presence of coronary angioplasty implant and graft: Secondary | ICD-10-CM

## 2021-07-28 NOTE — Progress Notes (Unsigned)
Cardiac Individual Treatment Plan  Patient Details  Name: Buffie Herne MRN: [id number] Date of Birth: [birth year] Referring Provider:   Flowsheet Row Cardiac Rehab from 04/28/2021 in [facility] Cardiac and Pulmonary Rehab  Referring Provider Vista Lawman MD       Initial Encounter Date:  Flowsheet Row Cardiac Rehab from 04/28/2021 in [facility] Cardiac and Pulmonary Rehab  Date 04/28/21       Visit Diagnosis: Status post coronary artery stent placement  Patient's Home Medications on Admission:  Current Outpatient Medications:    albuterol (VENTOLIN HFA) 108 (90 Base) MCG/ACT inhaler, Inhale 1-2 puffs into the lungs every 6 (six) hours as needed for wheezing or shortness of breath. (Patient not taking: Reported on 04/14/2021), Disp: 18 g, Rfl: 0   amitriptyline (ELAVIL) 25 MG tablet, TAKE 1 TABLET BY MOUTH EVERY DAY EVERY NIGHT, Disp: , Rfl:    aspirin EC 81 MG tablet, Take 81 mg by mouth daily., Disp: , Rfl:    benzonatate (TESSALON) 100 MG capsule, Take 2 capsules (200 mg total) by mouth 3 (three) times daily as needed for cough. (Patient not taking: Reported on 04/14/2021), Disp: 21 capsule, Rfl: 0   Biotin 5000 MCG SUBL, Place under the tongue., Disp: , Rfl:    carvedilol (COREG) 6.25 MG tablet, , Disp: , Rfl:    clindamycin (CLEOCIN T) 1 % external solution, APPLY TO AFFECTED AREA TWICE A DAY (Patient not taking: Reported on 04/14/2021), Disp: , Rfl:    clopidogrel (PLAVIX) 75 MG tablet, Take by mouth., Disp: , Rfl:    cyclobenzaprine (FLEXERIL) 10 MG tablet, Take 1 tablet (10 mg total) by mouth 2 (two) times daily as needed for muscle spasms., Disp: 20 tablet, Rfl: 0   Dulaglutide 1.5 MG/0.5ML SOPN, Inject into the skin., Disp: , Rfl:    furosemide (LASIX) 20 MG tablet, Take by mouth., Disp: , Rfl:    HYDROcodone-acetaminophen (NORCO/VICODIN) 5-325 MG tablet, Take 2 tablets by mouth every 4 (four) hours as needed. (Patient not taking: Reported on 04/14/2021), Disp: 6 tablet, Rfl: 0   insulin  glargine (LANTUS) 100 UNIT/ML injection, Inject into the skin daily., Disp: , Rfl:    Insulin Pen Needle (GLOBAL EASY GLIDE PEN NEEDLES) 32G X 4 MM MISC, Use 1 each nightly, Disp: , Rfl:    losartan (COZAAR) 50 MG tablet, Take by mouth., Disp: , Rfl:    metFORMIN (GLUCOPHAGE) 1000 MG tablet, Take 1,000 mg by mouth 2 (two) times daily with a meal., Disp: , Rfl:    naproxen (NAPROSYN) 500 MG tablet, Take 1 tablet (500 mg total) by mouth 2 (two) times daily as needed for moderate pain. (Patient not taking: Reported on 04/14/2021), Disp: 30 tablet, Rfl: 0   nitroGLYCERIN (NITROSTAT) 0.4 MG SL tablet, Place under the tongue., Disp: , Rfl:    Omega-3 Fatty Acids (FISH OIL) 1000 MG CAPS, Take 2 capsules by mouth 2 (two) times daily., Disp: , Rfl:    pantoprazole (PROTONIX) 40 MG tablet, Take 40 mg by mouth daily., Disp: , Rfl:    predniSONE (STERAPRED UNI-PAK 21 TAB) 10 MG (21) TBPK tablet, Take by mouth daily. Take 6 tabs by mouth daily  for 2 days, then 5 tabs for 2 days, then 4 tabs for 2 days, then 3 tabs for 2 days, 2 tabs for 2 days, then 1 tab by mouth daily for 2 days (Patient not taking: Reported on 04/14/2021), Disp: 42 tablet, Rfl: 0   REPATHA SURECLICK 140 MG/ML SOAJ, Inject into the skin.,  Disp: , Rfl:    spironolactone (ALDACTONE) 25 MG tablet, Take by mouth. (Patient not taking: Reported on 04/14/2021), Disp: , Rfl:    traZODone (DESYREL) 150 MG tablet, TAKE 1 TABLET (150 MG TOTAL) BY MOUTH NIGHTLY AS NEEDED FOR SLEEP., Disp: , Rfl:    valACYclovir (VALTREX) 1000 MG tablet, Take 1 tablet (1,000 mg total) by mouth 3 (three) times daily. (Patient not taking: Reported on 04/14/2021), Disp: 21 tablet, Rfl: 0   valACYclovir (VALTREX) 1000 MG tablet, Take by mouth., Disp: , Rfl:   Past Medical History: Past Medical History:  Diagnosis Date   Acid reflux    COVID-19 08/21/2019   diagnosed in urgent care on 08/21/2019   Diabetes [facility])    MI (myocardial infarction) (HCC)    Shingles 03/10/2019     Tobacco Use: Social History   Tobacco Use  Smoking Status Former  Smokeless Tobacco Never    Labs: Recent Review Flowsheet Data   There is no flowsheet data to display.      Exercise Target Goals: Exercise Program Goal: Individual exercise prescription set using results from initial 6 min walk test and THRR while considering  patient's activity barriers and safety.   Exercise Prescription Goal: Initial exercise prescription builds to 30-45 minutes a day of aerobic activity, 2-3 days per week.  Home exercise guidelines will be given to patient during program as part of exercise prescription that the participant will acknowledge.   Education: Aerobic Exercise: - Group verbal and visual presentation on the components of exercise prescription. Introduces F.I.T.T principle from ACSM for exercise prescriptions.  Reviews F.I.T.T. principles of aerobic exercise including progression. Written material given at graduation.   Education: Resistance Exercise: - Group verbal and visual presentation on the components of exercise prescription. Introduces F.I.T.T principle from ACSM for exercise prescriptions  Reviews F.I.T.T. principles of resistance exercise including progression. Written material given at graduation.    Education: Exercise & Equipment Safety: - Individual verbal instruction and demonstration of equipment use and safety with use of the equipment. Flowsheet Row Cardiac Rehab from 05/12/2021 in [facility] Cardiac and Pulmonary Rehab  Education need identified 04/28/21  Date 04/28/21  Educator KL  Instruction Review Code 1- Verbalizes Understanding       Education: Exercise Physiology & General Exercise Guidelines: - Group verbal and written instruction with models to review the exercise physiology of the cardiovascular system and associated critical values. Provides general exercise guidelines with specific guidelines to those with heart or lung disease.    Education:  Flexibility, Balance, Mind/Body Relaxation: - Group verbal and visual presentation with interactive activity on the components of exercise prescription. Introduces F.I.T.T principle from ACSM for exercise prescriptions. Reviews F.I.T.T. principles of flexibility and balance exercise training including progression. Also discusses the mind body connection.  Reviews various relaxation techniques to help reduce and manage stress (i.e. Deep breathing, progressive muscle relaxation, and visualization). Balance handout provided to take home. Written material given at graduation.   Activity Barriers & Risk Stratification:  Activity Barriers & Cardiac Risk Stratification - 04/28/21 1213       Activity Barriers & Cardiac Risk Stratification   Activity Barriers Other (comment);Joint Problems;Shortness of Breath;Chest Pain/Angina    Comments carpal tunnel surgery 02/12/21    Cardiac Risk Stratification High             6 Minute Walk:  6 Minute Walk     Row Name 04/28/21 1228         6 Minute Walk  Phase Initial     Distance 1165 feet     Walk Time 5 minutes     # of Rest Breaks 2  1:38-2:11; 4:02-4:32     MPH 2.64     METS 3.24     RPE 13     Perceived Dyspnea  2     VO2 Peak 11.35     Symptoms Yes (comment)     Comments Chest tightness 3/10- relieved with rest, SOB     Resting HR 84 bpm     Resting BP 128/68     Resting Oxygen Saturation  98 %     Exercise Oxygen Saturation  during 6 min walk 97 %     Max Ex. HR 106 bpm     Max Ex. BP 136/70     2 Minute Post BP 124/66              Oxygen Initial Assessment:   Oxygen Re-Evaluation:   Oxygen Discharge (Final Oxygen Re-Evaluation):   Initial Exercise Prescription:  Initial Exercise Prescription - 04/28/21 1300       Date of Initial Exercise RX and Referring Provider   Date 04/28/21    Referring Provider Vista Lawman MD      Treadmill   MPH 2.4    Grade 0    Minutes 15    METs 2.84      Recumbant Bike    Level 2    RPM 60    Watts 20    Minutes 15    METs 3.2      NuStep   Level 2    SPM 80    Minutes 15    METs 3.2      REL-XR   Level 2    Speed 50    Minutes 15    METs 3.2      T5 Nustep   Level 1    SPM 80    Minutes 15    METs 3.2      Biostep-RELP   Level 1    SPM 50    Minutes 15    METs 3.2      Prescription Details   Frequency (times per week) 3    Duration Progress to 30 minutes of continuous aerobic without signs/symptoms of physical distress      Intensity   THRR 40-80% of Max Heartrate 115-146    Ratings of Perceived Exertion 11-13    Perceived Dyspnea 0-4      Progression   Progression Continue to progress workloads to maintain intensity without signs/symptoms of physical distress.      Resistance Training   Training Prescription Yes    Weight 3 lb    Reps 10-15             Perform Capillary Blood Glucose checks as needed.  Exercise Prescription Changes:   Exercise Prescription Changes     Row Name 04/28/21 1300 05/03/21 1100 05/19/21 1400 05/31/21 1400 06/16/21 1600     Response to Exercise   Blood Pressure (Admit) 128/68 124/70 118/66 132/68 124/62   Blood Pressure (Exercise) 136/70 130/70 118/56 136/82 142/70   Blood Pressure (Exit) 124/66 104/68 122/68 110/62 128/68   Heart Rate (Admit) 84 bpm 96 bpm 90 bpm 95 bpm 100 bpm   Heart Rate (Exercise) 106 bpm 109 bpm 107 bpm 111 bpm 128 bpm   Heart Rate (Exit) 81 bpm 99 bpm 91 bpm 83 bpm 98 bpm   Oxygen  Saturation (Admit) 98 % -- -- -- --   Oxygen Saturation (Exercise) 97 % -- -- -- --   Oxygen Saturation (Exit) 97 % -- -- -- --   Rating of Perceived Exertion (Exercise) 13 15 15 13 15    Perceived Dyspnea (Exercise) 2 -- -- -- --   Symptoms chest tightness 3/10- relieved with rest; SOB none chest pain4/10 ongoing chest pain ongoing chest pain   Comments walk test results first full day of exercise -- -- --   Duration -- Progress to 30 minutes of  aerobic without signs/symptoms of  physical distress Progress to 30 minutes of  aerobic without signs/symptoms of physical distress Continue with 30 min of aerobic exercise without signs/symptoms of physical distress. Continue with 30 min of aerobic exercise without signs/symptoms of physical distress.   Intensity -- THRR unchanged THRR unchanged THRR unchanged THRR unchanged     Progression   Progression -- Continue to progress workloads to maintain intensity without signs/symptoms of physical distress. Continue to progress workloads to maintain intensity without signs/symptoms of physical distress. Continue to progress workloads to maintain intensity without signs/symptoms of physical distress. Continue to progress workloads to maintain intensity without signs/symptoms of physical distress.   Average METs -- 2.84 2.4 2.91 3     Resistance Training   Training Prescription -- Yes Yes Yes Yes   Weight -- 3 lb 3 lb 3 lb 3 lb   Reps -- 10-15 10-15 10-15 10-15     Interval Training   Interval Training -- -- -- No No     Treadmill   MPH -- 2.4 2.4 2.4 2.6   Grade -- 0 0 0 0   Minutes -- 15 15 15 15    METs -- 2.84 2.84 2.84 2.99     Recumbant Bike   Level -- -- -- 2 --   Minutes -- -- -- 15 --   METs -- -- -- 2.5 --     NuStep   Level -- -- -- 5 --   Minutes -- -- -- 15 --   METs -- -- -- 2.6 --     REL-XR   Level -- -- -- 2 --   Minutes -- -- -- 15 --   METs -- -- -- 3.7 --     T5 Nustep   Level -- 1 1 -- --   Minutes -- 15 15 -- --   METs -- -- 1.9 -- --            Exercise Comments:   Exercise Comments     Row Name 05/03/21 1000           Exercise Comments First full day of exercise!  Patient was oriented to gym and equipment including functions, settings, policies, and procedures.  Patient's individual exercise prescription and treatment plan were reviewed.  All starting workloads were established based on the results of the 6 minute walk test done at initial orientation visit.  The plan for  exercise progression was also introduced and progression will be customized based on patient's performance and goals.                Exercise Goals and Review:   Exercise Goals     Row Name 04/28/21 1235             Exercise Goals   Increase Physical Activity Yes       Intervention Provide advice, education, support and counseling about physical activity/exercise needs.;Develop an individualized exercise  prescription for aerobic and resistive training based on initial evaluation findings, risk stratification, comorbidities and participant's personal goals.       Expected Outcomes Long Term: Add in home exercise to make exercise part of routine and to increase amount of physical activity.;Short Term: Attend rehab on a regular basis to increase amount of physical activity.;Long Term: Exercising regularly at least 3-5 days a week.       Increase Strength and Stamina Yes       Intervention Provide advice, education, support and counseling about physical activity/exercise needs.;Develop an individualized exercise prescription for aerobic and resistive training based on initial evaluation findings, risk stratification, comorbidities and participant's personal goals.       Expected Outcomes Short Term: Increase workloads from initial exercise prescription for resistance, speed, and METs.;Short Term: Perform resistance training exercises routinely during rehab and add in resistance training at home;Long Term: Improve cardiorespiratory fitness, muscular endurance and strength as measured by increased METs and functional capacity ( )       Able to understand and use rate of perceived exertion (RPE) scale Yes       Intervention Provide education and explanation on how to use RPE scale       Expected Outcomes Short Term: Able to use RPE daily in rehab to express subjective intensity level;Long Term:  Able to use RPE to guide intensity level when exercising independently       Able to understand  and use Dyspnea scale Yes       Intervention Provide education and explanation on how to use Dyspnea scale       Expected Outcomes Short Term: Able to use Dyspnea scale daily in rehab to express subjective sense of shortness of breath during exertion;Long Term: Able to use Dyspnea scale to guide intensity level when exercising independently       Knowledge and understanding of Target Heart Rate Range (THRR) Yes       Intervention Provide education and explanation of THRR including how the numbers were predicted and where they are located for reference       Expected Outcomes Short Term: Able to state/look up THRR;Long Term: Able to use THRR to govern intensity when exercising independently;Short Term: Able to use daily as guideline for intensity in rehab       Able to check pulse independently Yes       Intervention Provide education and demonstration on how to check pulse in carotid and radial arteries.;Review the importance of being able to check your own pulse for safety during independent exercise       Expected Outcomes Long Term: Able to check pulse independently and accurately;Short Term: Able to explain why pulse checking is important during independent exercise       Understanding of Exercise Prescription Yes       Intervention Provide education, explanation, and written materials on patient's individual exercise prescription       Expected Outcomes Short Term: Able to explain program exercise prescription;Long Term: Able to explain home exercise prescription to exercise independently                Exercise Goals Re-Evaluation :  Exercise Goals Re-Evaluation     Row Name 05/03/21 1000 05/19/21 1457 05/31/21 1416 06/16/21 1607 06/28/21 1018     Exercise Goal Re-Evaluation   Exercise Goals Review Able to understand and use rate of perceived exertion (RPE) scale;Able to understand and use Dyspnea scale;Knowledge and understanding of Target Heart Rate Range (THRR);Understanding of  Exercise Prescription Increase Physical Activity;Increase Strength and Stamina Increase Physical Activity;Increase Strength and Stamina;Understanding of Exercise Prescription Increase Physical Activity;Increase Strength and Stamina Increase Physical Activity;Increase Strength and Stamina   Comments Reviewed RPE and dyspnea scales, THR and program prescription with pt today.  Pt voiced understanding and was given a copy of goals to take home. Dayra has had some chest pain during exercise that resolves with rest.  She saw her cardiologist last Friday.  She is out on vacation this week.  Staff will follow up when she returns. Sharene is doing well in rehab.  She continues to have intermitten chest pain with unknown etiology and her physcian is aware.  She is on level 5 on the NuStep. We will continue to monitor her progress. Nekia has been out for a week and may miss more sessions.  She is having further testing for ongoing chest pain Kasee still continues to be out of rehab. She is still waiting to hear back on her results from her monitor and her provider would like her to hold off until the results are back and she is cleared.   Expected Outcomes Short: Use RPE daily to regulate intensity. Long: Follow program prescription in THR. Short : attend consistently Long: build overall stamina Short: Try 4 lb weights Long: Continue to improve chest pain Short: follow up with Dr appointments Long:  get back to [facility] Short: Folow up with doctor regarding results Long: Attend cardiac rehab consistently            Discharge Exercise Prescription (Final Exercise Prescription Changes):  Exercise Prescription Changes - 06/16/21 1600       Response to Exercise   Blood Pressure (Admit) 124/62    Blood Pressure (Exercise) 142/70    Blood Pressure (Exit) 128/68    Heart Rate (Admit) 100 bpm    Heart Rate (Exercise) 128 bpm    Heart Rate (Exit) 98 bpm    Rating of Perceived Exertion (Exercise) 15    Symptoms  ongoing chest pain    Duration Continue with 30 min of aerobic exercise without signs/symptoms of physical distress.    Intensity THRR unchanged      Progression   Progression Continue to progress workloads to maintain intensity without signs/symptoms of physical distress.    Average METs 3      Resistance Training   Training Prescription Yes    Weight 3 lb    Reps 10-15      Interval Training   Interval Training No      Treadmill   MPH 2.6    Grade 0    Minutes 15    METs 2.99             Nutrition:  Target Goals: Understanding of nutrition guidelines, daily intake of sodium 1500mg , cholesterol 200mg , calories 30% from fat and 7% or less from saturated fats, daily to have 5 or more servings of fruits and vegetables.  Education: All About Nutrition: -Group instruction provided by verbal, written material, interactive activities, discussions, models, and posters to present general guidelines for heart healthy nutrition including fat, fiber, MyPlate, the role of sodium in heart healthy nutrition, utilization of the nutrition label, and utilization of this knowledge for meal planning. Follow up email sent as well. Written material given at graduation. Flowsheet Row Cardiac Rehab from 05/12/2021 in [facility] Cardiac and Pulmonary Rehab  Education need identified 04/28/21  Date 05/12/21  Educator [facility]  Instruction Review Code 1- [employer]  Biometrics:  Pre Biometrics - 04/28/21 1219       Pre Biometrics   Height 5\' 6"  (1.676 m)    Weight 187 lb 1.6 oz (84.9 kg)    BMI (Calculated) 30.21    Single Leg Stand 30 seconds              Nutrition Therapy Plan and Nutrition Goals:  Nutrition Therapy & Goals - 04/28/21 1216       Intervention Plan   Intervention Prescribe, educate and counsel regarding individualized specific dietary modifications aiming towards targeted core components such as weight, hypertension, lipid management, diabetes, heart  failure and other comorbidities.    Expected Outcomes Short Term Goal: Understand basic principles of dietary content, such as calories, fat, sodium, cholesterol and nutrients.;Long Term Goal: Adherence to prescribed nutrition plan.             Nutrition Assessments:  MEDIFICTS Score Key: ?70 Need to make dietary changes  40-70 Heart Healthy Diet ? 40 Therapeutic Level Cholesterol Diet  Flowsheet Row Cardiac Rehab from 04/28/2021 in [facility] Cardiac and Pulmonary Rehab  Picture Your Plate Total Score on Admission 59      Picture Your Plate Scores: [facility] Unhealthy dietary pattern with much room for improvement. 41-50 Dietary pattern unlikely to meet recommendations for good health and room for improvement. 51-60 More healthful dietary pattern, with some room for improvement.  >60 Healthy dietary pattern, although there may be some specific behaviors that could be improved.    Nutrition Goals Re-Evaluation:   Nutrition Goals Discharge (Final Nutrition Goals Re-Evaluation):   Psychosocial: Target Goals: Acknowledge presence or absence of significant depression and/or stress, maximize coping skills, provide positive support system. Participant is able to verbalize types and ability to use techniques and skills needed for reducing stress and depression.   Education: Stress, Anxiety, and Depression - Group verbal and visual presentation to define topics covered.  Reviews how body is impacted by stress, anxiety, and depression.  Also discusses healthy ways to reduce stress and to treat/manage anxiety and depression.  Written material given at graduation.   Education: Sleep Hygiene -Provides group verbal and written instruction about how sleep can affect your health.  Define sleep hygiene, discuss sleep cycles and impact of sleep habits. Review good sleep hygiene tips.    Initial Review & Psychosocial Screening:  Initial Psych Review & Screening - 04/14/21 1040       Initial Review    Current issues with Current Stress Concerns    Source of Stress Concerns Occupation      Family Dynamics   Good Support System? Yes   husband     Barriers   Psychosocial barriers to participate in program There are no identifiable barriers or psychosocial needs.;The patient should benefit from training in stress management and relaxation.      Screening Interventions   Interventions Encouraged to exercise;Provide feedback about the scores to participant;To provide support and resources with identified psychosocial needs    Expected Outcomes Short Term goal: Utilizing psychosocial counselor, staff and physician to assist with identification of specific Stressors or current issues interfering with healing process. Setting desired goal for each stressor or current issue identified.;Long Term Goal: Stressors or current issues are controlled or eliminated.;Short Term goal: Identification and review with participant of any Quality of Life or Depression concerns found by scoring the questionnaire.;Long Term goal: The participant improves quality of Life and PHQ9 Scores as seen by post scores and/or verbalization of changes  Quality of Life Scores:   Quality of Life - 04/28/21 1214       Quality of Life   Select Quality of Life      Quality of Life Scores   Health/Function Pre 10.8 %    Socioeconomic Pre 24.13 %    Psych/Spiritual Pre 14.57 %    Family Pre 26.4 %    GLOBAL Pre 16.83 %            Scores of 19 and below usually indicate a poorer quality of life in these areas.  A difference of  2-3 points is a clinically meaningful difference.  A difference of 2-3 points in the total score of the Quality of Life Index has been associated with significant improvement in overall quality of life, self-image, physical symptoms, and general health in studies assessing change in quality of life.  PHQ-9: Recent Review Flowsheet Data     Depression screen [facility] 2/9 04/28/2021    Decreased Interest 2   Down, Depressed, Hopeless 1   PHQ - 2 Score 3   Altered sleeping 0   Tired, decreased energy 1   Change in appetite 1   Feeling bad or failure about yourself  0   Trouble concentrating 0   Moving slowly or fidgety/restless 1   Suicidal thoughts 0   PHQ-9 Score 6   Difficult doing work/chores Somewhat difficult      Interpretation of Total Score  Total Score Depression Severity:  1-4 = Minimal depression, 5-9 = Mild depression, 10-14 = Moderate depression, 15-19 = Moderately severe depression, 20-27 = Severe depression   Psychosocial Evaluation and Intervention:  Psychosocial Evaluation - 04/14/21 1101       Psychosocial Evaluation & Interventions   Interventions Encouraged to exercise with the program and follow exercise prescription;Stress management education;Relaxation education    Comments Azrael is coming post stent to cardiac rehab. She is currenlty out of work until October so she has notices a huge decrease in her stress level. She states her job is extremely stressful and she doesn't know what it will be like when she goes back. Her husband is very supportive and has been by her side during her cardiac issues and her recent carpal tunnel surgery. She will need further interventions on her hands/wrists. She is motivated to attend cardiac rehab to gain knowledge about her a heart healthy lifestyle and get back to walking.    Expected Outcomes Short: attend cardiac rehab for education and exercise. Long: Develop and maintain positive self care habits.    Continue Psychosocial Services  Follow up required by staff             Psychosocial Re-Evaluation:   Psychosocial Discharge (Final Psychosocial Re-Evaluation):   Vocational Rehabilitation: Provide vocational rehab assistance to qualifying candidates.   Vocational Rehab Evaluation & Intervention:  Vocational Rehab - 04/14/21 1037       Initial Vocational Rehab Evaluation & Intervention    Assessment shows need for Vocational Rehabilitation No             Education: Education Goals: Education classes will be provided on a variety of topics geared toward better understanding of heart health and risk factor modification. Participant will state understanding/return demonstration of topics presented as noted by education test scores.  Learning Barriers/Preferences:  Learning Barriers/Preferences - 04/14/21 1037       Learning Barriers/Preferences   Learning Barriers None    Learning Preferences None  General Cardiac Education Topics:  AED/CPR: - Group verbal and written instruction with the use of models to demonstrate the basic use of the AED with the basic ABC's of resuscitation.   Anatomy and Cardiac Procedures: - Group verbal and visual presentation and models provide information about basic cardiac anatomy and function. Reviews the testing methods done to diagnose heart disease and the outcomes of the test results. Describes the treatment choices: Medical Management, Angioplasty, or Coronary Bypass Surgery for treating various heart conditions including Myocardial Infarction, Angina, Valve Disease, and Cardiac Arrhythmias.  Written material given at graduation.   Medication Safety: - Group verbal and visual instruction to review commonly prescribed medications for heart and lung disease. Reviews the medication, class of the drug, and side effects. Includes the steps to properly store meds and maintain the prescription regimen.  Written material given at graduation. Flowsheet Row Cardiac Rehab from 05/12/2021 in [facility] Cardiac and Pulmonary Rehab  Date 05/05/21  Educator SB  Instruction Review Code 1- Verbalizes Understanding       Intimacy: - Group verbal instruction through game format to discuss how heart and lung disease can affect sexual intimacy. Written material given at graduation..   Know Your Numbers and Heart Failure: - Group verbal  and visual instruction to discuss disease risk factors for cardiac and pulmonary disease and treatment options.  Reviews associated critical values for Overweight/Obesity, Hypertension, Cholesterol, and Diabetes.  Discusses basics of heart failure: signs/symptoms and treatments.  Introduces Heart Failure Zone chart for action plan for heart failure.  Written material given at graduation. Flowsheet Row Cardiac Rehab from 05/12/2021 in [facility] Cardiac and Pulmonary Rehab  Education need identified 04/28/21       Infection Prevention: - Provides verbal and written material to individual with discussion of infection control including proper hand washing and proper equipment cleaning during exercise session. Flowsheet Row Cardiac Rehab from 05/12/2021 in [facility] Cardiac and Pul[facility] Rehab  Education need identified 04/28/21  Date 04/28/21  Educator KL  Instruction Review Code 1- Verbalizes Understanding       Falls Prevention: - Provides verbal and written material to individual with discussion of falls prevention and safety. Flowsheet Row Cardiac Rehab from 05/12/2021 in [facility] Cardiac and Pulmonary Rehab  Education need identified 04/28/21  Date 04/28/21  Educator KL  Instruction Review Code 1- Verbalizes Understanding       Other: -Provides group and verbal instruction on various topics (see comments)   Knowledge Questionnaire Score:  Knowledge Questionnaire Score - 04/28/21 1215       Knowledge Questionnaire Score   Pre Score 24/26: HF, Nutrition             Core Components/Risk Factors/Patient Goals at Admission:  Personal Goals and Risk Factors at Admission - 04/28/21 1310       Core Components/Risk Factors/Patient Goals on Admission    Weight Management Yes;Weight Loss    Intervention Weight Management: Develop a combined nutrition and exercise program designed to reach desired caloric intake, while maintaining appropriate intake of nutrient and fiber, sodium and fats, and  appropriate energy expenditure required for the weight goal.;Weight Management: Provide education and appropriate resources to help participant work on and attain dietary goals.;Weight Management/Obesity: Establish reasonable short term and long term weight goals.    Admit Weight 187 lb (84.8 kg)    Goal Weight: Short Term 182 lb (82.6 kg)    Goal Weight: Long Term 175 lb (79.4 kg)    Expected Outcomes Short Term: Continue to assess  and modify interventions until short term weight is achieved;Weight Loss: Understanding of general recommendations for a balanced deficit meal plan, which promotes 1-2 lb weight loss per week and includes a negative energy balance of [phone] kcal/d;Understanding recommendations for meals to include 15-35% energy as protein, 25-35% energy from fat, 35-60% energy from carbohydrates, less than 200mg  of dietary cholesterol, 20-35 gm of total fiber daily;Understanding of distribution of calorie intake throughout the day with the consumption of 4-5 meals/snacks    Diabetes Yes    Intervention Provide education about signs/symptoms and action to take for hypo/hyperglycemia.;Provide education about proper nutrition, including hydration, and aerobic/resistive exercise prescription along with prescribed medications to achieve blood glucose in normal ranges: Fasting glucose 65-99 mg/dL    Expected Outcomes Long Term: Attainment of HbA1C < 7%.;Short Term: Participant verbalizes understanding of the signs/symptoms and immediate care of hyper/hypoglycemia, proper foot care and importance of medication, aerobic/resistive exercise and nutrition plan for blood glucose control.    Hypertension Yes    Intervention Provide education on lifestyle modifcations including regular physical activity/exercise, weight management, moderate sodium restriction and increased consumption of fresh fruit, vegetables, and low fat dairy, alcohol moderation, and smoking cessation.;Monitor prescription use  compliance.    Expected Outcomes Short Term: Continued assessment and intervention until BP is < 140/80mm HG in hypertensive participants. < 130/58mm HG in hypertensive participants with diabetes, heart failure or chronic kidney disease.;Long Term: Maintenance of blood pressure at goal levels.    Lipids Yes    Intervention Provide education and support for participant on nutrition & aerobic/resistive exercise along with prescribed medications to achieve LDL 70mg , HDL >40mg .    Expected Outcomes Short Term: Participant states understanding of desired cholesterol values and is compliant with medications prescribed. Participant is following exercise prescription and nutrition guidelines.;Long Term: Cholesterol controlled with medications as prescribed, with individualized exercise RX and with personalized nutrition plan. Value goals: LDL < 70mg , HDL > 40 mg.             Education:Diabetes - Individual verbal and written instruction to review signs/symptoms of diabetes, desired ranges of glucose level fasting, after meals and with exercise. Acknowledge that pre and post exercise glucose checks will be done for 3 sessions at entry of program. Flowsheet Row Cardiac Rehab from 05/12/2021 in [facility] Cardiac and Pulmonary Rehab  Education need identified 04/28/21  Date 04/28/21  Educator KL  Instruction Review Code 1- Verbalizes Understanding       Core Components/Risk Factors/Patient Goals Review:    Core Components/Risk Factors/Patient Goals at Discharge (Final Review):    ITP Comments:  ITP Comments     Row Name 04/14/21 1035 04/28/21 1235 05/03/21 0959 05/26/21 0935 06/14/21 07/26/21   ITP Comments Initial telephone orientation completed. Diagnosis can be found in [facility] 8/4. EP orientation scheduled Wednesday 9/7 at 11am. Completed 2423 and gym orientation. Initial ITP created and sent for review to Dr. 04-06-1973, Medical Director. First full day of exercise!  Patient was oriented to gym and  equipment including functions, settings, policies, and procedures.  Patient's individual exercise prescription and treatment plan were reviewed.  All starting workloads were established based on the results of the 6 minute walk test done at initial orientation visit.  The plan for exercise progression was also introduced and progression will be customized based on patient's performance and goals. 30 day review completed. ITP sent to Dr. , Medical Director of Cardiac Rehab. Continue with ITP unless changes are made by physician. Ysabelle is having a  nuclear stress test Friday, then wearing a monitor and having a cath to determine why she is having continued chest pain.  She will not attend for the next 2 weeks and will let us know when she can return.    Row Name 06/23/21 0736 06/28/21 1024 07/14/21 1620 07/21/21 0722 07/28/21 1038   ITP Comments 30 Day review completed. Medical Director ITP review done, changes made as directed, and signed approval by Medical Director.   Remains out medical reason Raeden still continues to be out of rehab. She is still waiting to hear back on her results from her monitor and her provider would like her to hold off until the results are back and she is cleared. Eryca is still waiting for results and clearance to return. 30 Day review completed. Medical Director ITP review done, changes made as directed, and signed approval by Medical Director. Ryen called [place] back.  They are still not sure about what is going on with her chest pain.  She will discharge at this time and get a new referral when she is able to return.            Comments: Discharge ITP

## 2021-07-28 NOTE — Progress Notes (Unsigned)
Discharge Progress Report  Patient Details  Name: Nicole Singh MRN: [id number] Date of Birth: [birth year] Referring Provider:   Flowsheet Row Cardiac Rehab from 04/28/2021 in [facility] Cardiac and Pulmonary Rehab  Referring Provider Vista Lawman MD        Number of Visits: 12  Reason for Discharge:  Early Exit:  Lack of attendance and Ongoing Chest pain with unknown etiology  Smoking History:  Social History   Tobacco Use  Smoking Status Former  Smokeless Tobacco Never    Diagnosis:  Status post coronary artery stent placement  ADL UCSD:   Initial Exercise Prescription:  Initial Exercise Prescription - 04/28/21 1300       Date of Initial Exercise RX and Referring Provider   Date 04/28/21    Referring Provider Vista Lawman MD      Treadmill   MPH 2.4    Grade 0    Minutes 15    METs 2.84      Recumbant Bike   Level 2    RPM 60    Watts 20    Minutes 15    METs 3.2      NuStep   Level 2    SPM 80    Minutes 15    METs 3.2      REL-XR   Level 2    Speed 50    Minutes 15    METs 3.2      T5 Nustep   Level 1    SPM 80    Minutes 15    METs 3.2      Biostep-RELP   Level 1    SPM 50    Minutes 15    METs 3.2      Prescription Details   Frequency (times per week) 3    Duration Progress to 30 minutes of continuous aerobic without signs/symptoms of physical distress      Intensity   THRR 40-80% of Max Heartrate 115-146    Ratings of Perceived Exertion 11-13    Perceived Dyspnea 0-4      Progression   Progression Continue to progress workloads to maintain intensity without signs/symptoms of physical distress.      Resistance Training   Training Prescription Yes    Weight 3 lb    Reps 10-15             Discharge Exercise Prescription (Final Exercise Prescription Changes):  Exercise Prescription Changes - 06/16/21 1600       Response to Exercise   Blood Pressure (Admit) 124/62    Blood Pressure (Exercise) 142/70    Blood  Pressure (Exit) 128/68    Heart Rate (Admit) 100 bpm    Heart Rate (Exercise) 128 bpm    Heart Rate (Exit) 98 bpm    Rating of Perceived Exertion (Exercise) 15    Symptoms ongoing chest pain    Duration Continue with 30 min of aerobic exercise without signs/symptoms of physical distress.    Intensity THRR unchanged      Progression   Progression Continue to progress workloads to maintain intensity without signs/symptoms of physical distress.    Average METs 3      Resistance Training   Training Prescription Yes    Weight 3 lb    Reps 10-15      Interval Training   Interval Training No      Treadmill   MPH 2.6    Grade 0    Minutes 15    METs  2.99             Functional Capacity:  6 Minute Walk     Row Name 04/28/21 1228         6 Minute Walk   Phase Initial     Distance 1165 feet     Walk Time 5 minutes     # of Rest Breaks 2  1:38-2:11; 4:02-4:32     MPH 2.64     METS 3.24     RPE 13     Perceived Dyspnea  2     VO2 Peak 11.35     Symptoms Yes (comment)     Comments Chest tightness 3/10- relieved with rest, SOB     Resting HR 84 bpm     Resting BP 128/68     Resting Oxygen Saturation  98 %     Exercise Oxygen Saturation  during 6 min walk 97 %     Max Ex. HR 106 bpm     Max Ex. BP 136/70     2 Minute Post BP 124/66              Psychological, QOL, Others - Outcomes: PHQ 2/9: Depression screen PHQ 2/9 04/28/2021  Decreased Interest 2  Down, Depressed, Hopeless 1  PHQ - 2 Score 3  Altered sleeping 0  Tired, decreased energy 1  Change in appetite 1  Feeling bad or failure about yourself  0  Trouble concentrating 0  Moving slowly or fidgety/restless 1  Suicidal thoughts 0  PHQ-9 Score 6  Difficult doing work/chores Somewhat difficult    Quality of Life:  Quality of Life - 04/28/21 1214       Quality of Life   Select Quality of Life      Quality of Life Scores   Health/Function Pre 10.8 %    Socioeconomic Pre 24.13 %     Psych/Spiritual Pre 14.57 %    Family Pre 26.4 %    GLOBAL Pre 16.83 %             Nutrition & Weight - Outcomes:  Pre Biometrics - 04/28/21 1219       Pre Biometrics   Height 5\' 6"  (1.676 m)    Weight 187 lb 1.6 oz (84.9 kg)    BMI (Calculated) 30.21    Single Leg Stand 30 seconds              Nutrition:  Nutrition Therapy & Goals - 04/28/21 1216       Intervention Plan   Intervention Prescribe, educate and counsel regarding individualized specific dietary modifications aiming towards targeted core components such as weight, hypertension, lipid management, diabetes, heart failure and other comorbidities.    Expected Outcomes Short Term Goal: Understand basic principles of dietary content, such as calories, fat, sodium, cholesterol and nutrients.;Long Term Goal: Adherence to prescribed nutrition plan.            Goals reviewed with patient; copy given to patient.

## 2021-08-18 ENCOUNTER — Encounter: Payer: Self-pay | Admitting: *Deleted

## 2021-08-18 DIAGNOSIS — Z955 Presence of coronary angioplasty implant and graft: Secondary | ICD-10-CM

## 2021-08-18 NOTE — Progress Notes (Signed)
Cardiac Individual Treatment Plan  Patient Details  Name: Nicole Singh MRN: [id number] Date of Birth: 1961-08-12 Referring Provider:   Flowsheet Row Cardiac Rehab from 04/28/2021 in [facility] Cardiac and Pulmonary Rehab  Referring Provider Vista Lawman MD       Initial Encounter Date:  Flowsheet Row Cardiac Rehab from 04/28/2021 in [facility] Cardiac and Pulmonary Rehab  Date 04/28/21       Visit Diagnosis: Status post coronary artery stent placement  Patient's Home Medications on Admission:  Current Outpatient Medications:    albuterol (VENTOLIN HFA) 108 (90 Base) MCG/ACT inhaler, Inhale 1-2 puffs into the lungs every 6 (six) hours as needed for wheezing or shortness of breath. (Patient not taking: Reported on 04/14/2021), Disp: 18 g, Rfl: 0   amitriptyline (ELAVIL) 25 MG tablet, TAKE 1 TABLET BY MOUTH EVERY DAY EVERY NIGHT, Disp: , Rfl:    aspirin EC 81 MG tablet, Take 81 mg by mouth daily., Disp: , Rfl:    benzonatate (TESSALON) 100 MG capsule, Take 2 capsules (200 mg total) by mouth 3 (three) times daily as needed for cough. (Patient not taking: Reported on 04/14/2021), Disp: 21 capsule, Rfl: 0   Biotin 5000 MCG SUBL, Place under the tongue., Disp: , Rfl:    carvedilol (COREG) 6.25 MG tablet, , Disp: , Rfl:    clindamycin (CLEOCIN T) 1 % external solution, APPLY TO AFFECTED AREA TWICE A DAY (Patient not taking: Reported on 04/14/2021), Disp: , Rfl:    clopidogrel (PLAVIX) 75 MG tablet, Take by mouth., Disp: , Rfl:    cyclobenzaprine (FLEXERIL) 10 MG tablet, Take 1 tablet (10 mg total) by mouth 2 (two) times daily as needed for muscle spasms., Disp: 20 tablet, Rfl: 0   Dulaglutide 1.5 MG/0.5ML SOPN, Inject into the skin., Disp: , Rfl:    furosemide (LASIX) 20 MG tablet, Take by mouth., Disp: , Rfl:    HYDROcodone-acetaminophen (NORCO/VICODIN) 5-325 MG tablet, Take 2 tablets by mouth every 4 (four) hours as needed. (Patient not taking: Reported on 04/14/2021), Disp: 6 tablet, Rfl: 0   insulin  glargine (LANTUS) 100 UNIT/ML injection, Inject into the skin daily., Disp: , Rfl:    Insulin Pen Needle (GLOBAL EASY GLIDE PEN NEEDLES) 32G X 4 MM MISC, Use 1 each nightly, Disp: , Rfl:    losartan (COZAAR) 50 MG tablet, Take by mouth., Disp: , Rfl:    metFORMIN (GLUCOPHAGE) 1000 MG tablet, Take 1,000 mg by mouth 2 (two) times daily with a meal., Disp: , Rfl:    naproxen (NAPROSYN) 500 MG tablet, Take 1 tablet (500 mg total) by mouth 2 (two) times daily as needed for moderate pain. (Patient not taking: Reported on 04/14/2021), Disp: 30 tablet, Rfl: 0   nitroGLYCERIN (NITROSTAT) 0.4 MG SL tablet, Place under the tongue., Disp: , Rfl:    Omega-3 Fatty Acids (FISH OIL) 1000 MG CAPS, Take 2 capsules by mouth 2 (two) times daily., Disp: , Rfl:    pantoprazole (PROTONIX) 40 MG tablet, Take 40 mg by mouth daily., Disp: , Rfl:    predniSONE (STERAPRED UNI-PAK 21 TAB) 10 MG (21) TBPK tablet, Take by mouth daily. Take 6 tabs by mouth daily  for 2 days, then 5 tabs for 2 days, then 4 tabs for 2 days, then 3 tabs for 2 days, 2 tabs for 2 days, then 1 tab by mouth daily for 2 days (Patient not taking: Reported on 04/14/2021), Disp: 42 tablet, Rfl: 0   REPATHA SURECLICK 140 MG/ML SOAJ, Inject into the skin.,  Disp: , Rfl:    spironolactone (ALDACTONE) 25 MG tablet, Take by mouth. (Patient not taking: Reported on 04/14/2021), Disp: , Rfl:    traZODone (DESYREL) 150 MG tablet, TAKE 1 TABLET (150 MG TOTAL) BY MOUTH NIGHTLY AS NEEDED FOR SLEEP., Disp: , Rfl:    valACYclovir (VALTREX) 1000 MG tablet, Take 1 tablet (1,000 mg total) by mouth 3 (three) times daily. (Patient not taking: Reported on 04/14/2021), Disp: 21 tablet, Rfl: 0   valACYclovir (VALTREX) 1000 MG tablet, Take by mouth., Disp: , Rfl:   Past Medical History: Past Medical History:  Diagnosis Date   Acid reflux    COVID-19 08/21/2019   diagnosed in urgent care on 08/21/2019   Diabetes [facility])    MI (myocardial infarction) (HCC)    Shingles 03/10/2019     Tobacco Use: Social History   Tobacco Use  Smoking Status Former  Smokeless Tobacco Never    Labs: Recent Review Flowsheet Data   There is no flowsheet data to display.      Exercise Target Goals: Exercise Program Goal: Individual exercise prescription set using results from initial 6 min walk test and THRR while considering  patients activity barriers and safety.   Exercise Prescription Goal: Initial exercise prescription builds to 30-45 minutes a day of aerobic activity, 2-3 days per week.  Home exercise guidelines will be given to patient during program as part of exercise prescription that the participant will acknowledge.   Education: Aerobic Exercise: - Group verbal and visual presentation on the components of exercise prescription. Introduces F.I.T.T principle from ACSM for exercise prescriptions.  Reviews F.I.T.T. principles of aerobic exercise including progression. Written material given at graduation.   Education: Resistance Exercise: - Group verbal and visual presentation on the components of exercise prescription. Introduces F.I.T.T principle from ACSM for exercise prescriptions  Reviews F.I.T.T. principles of resistance exercise including progression. Written material given at graduation.    Education: Exercise & Equipment Safety: - Individual verbal instruction and demonstration of equipment use and safety with use of the equipment. Flowsheet Row Cardiac Rehab from 05/12/2021 in [facility] Cardiac and Pulmonary Rehab  Education need identified 04/28/21  Date 04/28/21  Educator KL  Instruction Review Code 1- Verbalizes Understanding       Education: Exercise Physiology & General Exercise Guidelines: - Group verbal and written instruction with models to review the exercise physiology of the cardiovascular system and associated critical values. Provides general exercise guidelines with specific guidelines to those with heart or lung disease.    Education:  Flexibility, Balance, Mind/Body Relaxation: - Group verbal and visual presentation with interactive activity on the components of exercise prescription. Introduces F.I.T.T principle from ACSM for exercise prescriptions. Reviews F.I.T.T. principles of flexibility and balance exercise training including progression. Also discusses the mind body connection.  Reviews various relaxation techniques to help reduce and manage stress (i.e. Deep breathing, progressive muscle relaxation, and visualization). Balance handout provided to take home. Written material given at graduation.   Activity Barriers & Risk Stratification:   6 Minute Walk:   Oxygen Initial Assessment:   Oxygen Re-Evaluation:   Oxygen Discharge (Final Oxygen Re-Evaluation):   Initial Exercise Prescription:   Perform Capillary Blood Glucose checks as needed.  Exercise Prescription Changes:   Exercise Comments:   Exercise Goals and Review:   Exercise Goals Re-Evaluation :   Discharge Exercise Prescription (Final Exercise Prescription Changes):   Nutrition:  Target Goals: Understanding of nutrition guidelines, daily intake of sodium 1500mg , cholesterol 200mg , calories 30% from fat and 7%  or less from saturated fats, daily to have 5 or more servings of fruits and vegetables.  Education: All About Nutrition: -Group instruction provided by verbal, written material, interactive activities, discussions, models, and posters to present general guidelines for heart healthy nutrition including fat, fiber, MyPlate, the role of sodium in heart healthy nutrition, utilization of the nutrition label, and utilization of this knowledge for meal planning. Follow up email sent as well. Written material given at graduation. Flowsheet Row Cardiac Rehab from 05/12/2021 in [facility] Cardiac and Pulmonary Rehab  Education need identified 04/28/21  Date 05/12/21  Educator MC  Instruction Review Code 1- Verbalizes Understanding        Biometrics:    Nutrition Therapy Plan and Nutrition Goals:   Nutrition Assessments:  MEDIFICTS Score Key: ?70 Need to make dietary changes  40-70 Heart Healthy Diet ? 40 Therapeutic Level Cholesterol Diet  Flowsheet Row Cardiac Rehab from 04/28/2021 in [facility] Cardiac and Pulmonary Rehab  Picture Your Plate Total Score on Admission 59      Picture Your Plate Scores: <45 Unhealthy dietary pattern with much room for improvement. 41-50 Dietary pattern unlikely to meet recommendations for good health and room for improvement. 51-60 More healthful dietary pattern, with some room for improvement.  >60 Healthy dietary pattern, although there may be some specific behaviors that could be improved.    Nutrition Goals Re-Evaluation:   Nutrition Goals Discharge (Final Nutrition Goals Re-Evaluation):   Psychosocial: Target Goals: Acknowledge presence or absence of significant depression and/or stress, maximize coping skills, provide positive support system. Participant is able to verbalize types and ability to use techniques and skills needed for reducing stress and depression.   Education: Stress, Anxiety, and Depression - Group verbal and visual presentation to define topics covered.  Reviews how body is impacted by stress, anxiety, and depression.  Also discusses healthy ways to reduce stress and to treat/manage anxiety and depression.  Written material given at graduation.   Education: Sleep Hygiene -Provides group verbal and written instruction about how sleep can affect your health.  Define sleep hygiene, discuss sleep cycles and impact of sleep habits. Review good sleep hygiene tips.    Initial Review & Psychosocial Screening:   Quality of Life Scores:   Scores of 19 and below usually indicate a poorer quality of life in these areas.  A difference of  2-3 points is a clinically meaningful difference.  A difference of 2-3 points in the total score of the Quality of Life  Index has been associated with significant improvement in overall quality of life, self-image, physical symptoms, and general health in studies assessing change in quality of life.  PHQ-9: Recent Review Flowsheet Data     Depression screen [facility] 2/9 04/28/2021   Decreased Interest 2   Down, Depressed, Hopeless 1   PHQ - 2 Score 3   Altered sleeping 0   Tired, decreased energy 1   Change in appetite 1   Feeling bad or failure about yourself  0   Trouble concentrating 0   Moving slowly or fidgety/restless 1   Suicidal thoughts 0   PHQ-9 Score 6   Difficult doing work/chores Somewhat difficult      Interpretation of Total Score  Total Score Depression Severity:  1-4 = Minimal depression, 5-9 = Mild depression, 10-14 = Moderate depression, 15-19 = Moderately severe depression, 20-27 = Severe depression   Psychosocial Evaluation and Intervention:   Psychosocial Re-Evaluation:   Psychosocial Discharge (Final Psychosocial Re-Evaluation):   Vocational Rehabilitation: Provide  vocational rehab assistance to qualifying candidates.   Vocational Rehab Evaluation & Intervention:   Education: Education Goals: Education classes will be provided on a variety of topics geared toward better understanding of heart health and risk factor modification. Participant will state understanding/return demonstration of topics presented as noted by education test scores.  Learning Barriers/Preferences:   General Cardiac Education Topics:  AED/CPR: - Group verbal and written instruction with the use of models to demonstrate the basic use of the AED with the basic ABC's of resuscitation.   Anatomy and Cardiac Procedures: - Group verbal and visual presentation and models provide information about basic cardiac anatomy and function. Reviews the testing methods done to diagnose heart disease and the outcomes of the test results. Describes the treatment choices: Medical Management, Angioplasty, or  Coronary Bypass Surgery for treating various heart conditions including Myocardial Infarction, Angina, Valve Disease, and Cardiac Arrhythmias.  Written material given at graduation.   Medication Safety: - Group verbal and visual instruction to review commonly prescribed medications for heart and lung disease. Reviews the medication, class of the drug, and side effects. Includes the steps to properly store meds and maintain the prescription regimen.  Written material given at graduation. Flowsheet Row Cardiac Rehab from 05/12/2021 in [facility] Cardiac and Pulmonary Rehab  Date 05/05/21  Educator SB  Instruction Review Code 1- Verbalizes Understanding       Intimacy: - Group verbal instruction through game format to discuss how heart and lung disease can affect sexual intimacy. Written material given at graduation..   Know Your Numbers and Heart Failure: - Group verbal and visual instruction to discuss disease risk factors for cardiac and pulmonary disease and treatment options.  Reviews associated critical values for Overweight/Obesity, Hypertension, Cholesterol, and Diabetes.  Discusses basics of heart failure: signs/symptoms and treatments.  Introduces Heart Failure Zone chart for action plan for heart failure.  Written material given at graduation. Flowsheet Row Cardiac Rehab from 05/12/2021 in [facility] Cardiac and Pulmonary Rehab  Education need identified 04/28/21       Infection Prevention: - Provides verbal and written material to individual with discussion of infection control including proper hand washing and proper equipment cleaning during exercise session. Flowsheet Row Cardiac Rehab from 05/12/2021 in [facility] Cardiac and Pulmonary Rehab  Education need identified 04/28/21  Date 04/28/21  Educator KL  Instruction Review Code 1- Verbalizes Understanding       Falls Prevention: - Provides verbal and written material to individual with discussion of falls prevention and  safety. Flowsheet Row Cardiac Rehab from 05/12/2021 in [facility] Cardiac and Pulmonary Rehab  Education need identified 04/28/21  Date 04/28/21  Educator KL  Instruction Review Code 1- Verbalizes Understanding       Other: -Provides group and verbal instruction on various topics (see comments)   Knowledge Questionnaire Score:   Core Components/Risk Factors/Patient Goals at Admission:   Education:Diabetes - Individual verbal and written instruction to review signs/symptoms of diabetes, desired ranges of glucose level fasting, after meals and with exercise. Acknowledge that pre and post exercise glucose checks will be done for 3 sessions at entry of program. Flowsheet Row Cardiac Rehab from 05/12/2021 in [facility] Cardiac and Pulmonary Rehab  Education need identified 04/28/21  Date 04/28/21  Educator KL  Instruction Review Code 1- Verbalizes Understanding       Core Components/Risk Factors/Patient Goals Review:    Core Components/Risk Factors/Patient Goals at Discharge (Final Review):    ITP Comments:  ITP Comments     Row Name 06/23/21  [id number] 07/14/21 1620 07/21/21 0722 07/28/21 1038 08/18/21 1140   ITP Comments 30 Day review completed. Medical Director ITP review done, changes made as directed, and signed approval by Medical Director.   Remains out medical reason Sevin is still waiting for results and clearance to return. 30 Day review completed. Medical Director ITP review done, changes made as directed, and signed approval by Medical Director. Phil called [place] back.  They are still not sure about what is going on with her chest pain.  She will discharge at this time and get a new referral when she is able to return. Discharged            Comments: Discharged

## 2021-09-15 IMAGING — CR DG CHEST 2V
1 series · 1 of 1 positions shown · non-contrast
Comparison: 08/30/2019

CLINICAL DATA: Cough, hemoptysis

EXAM:
CHEST - 2 VIEW

[chest lat]
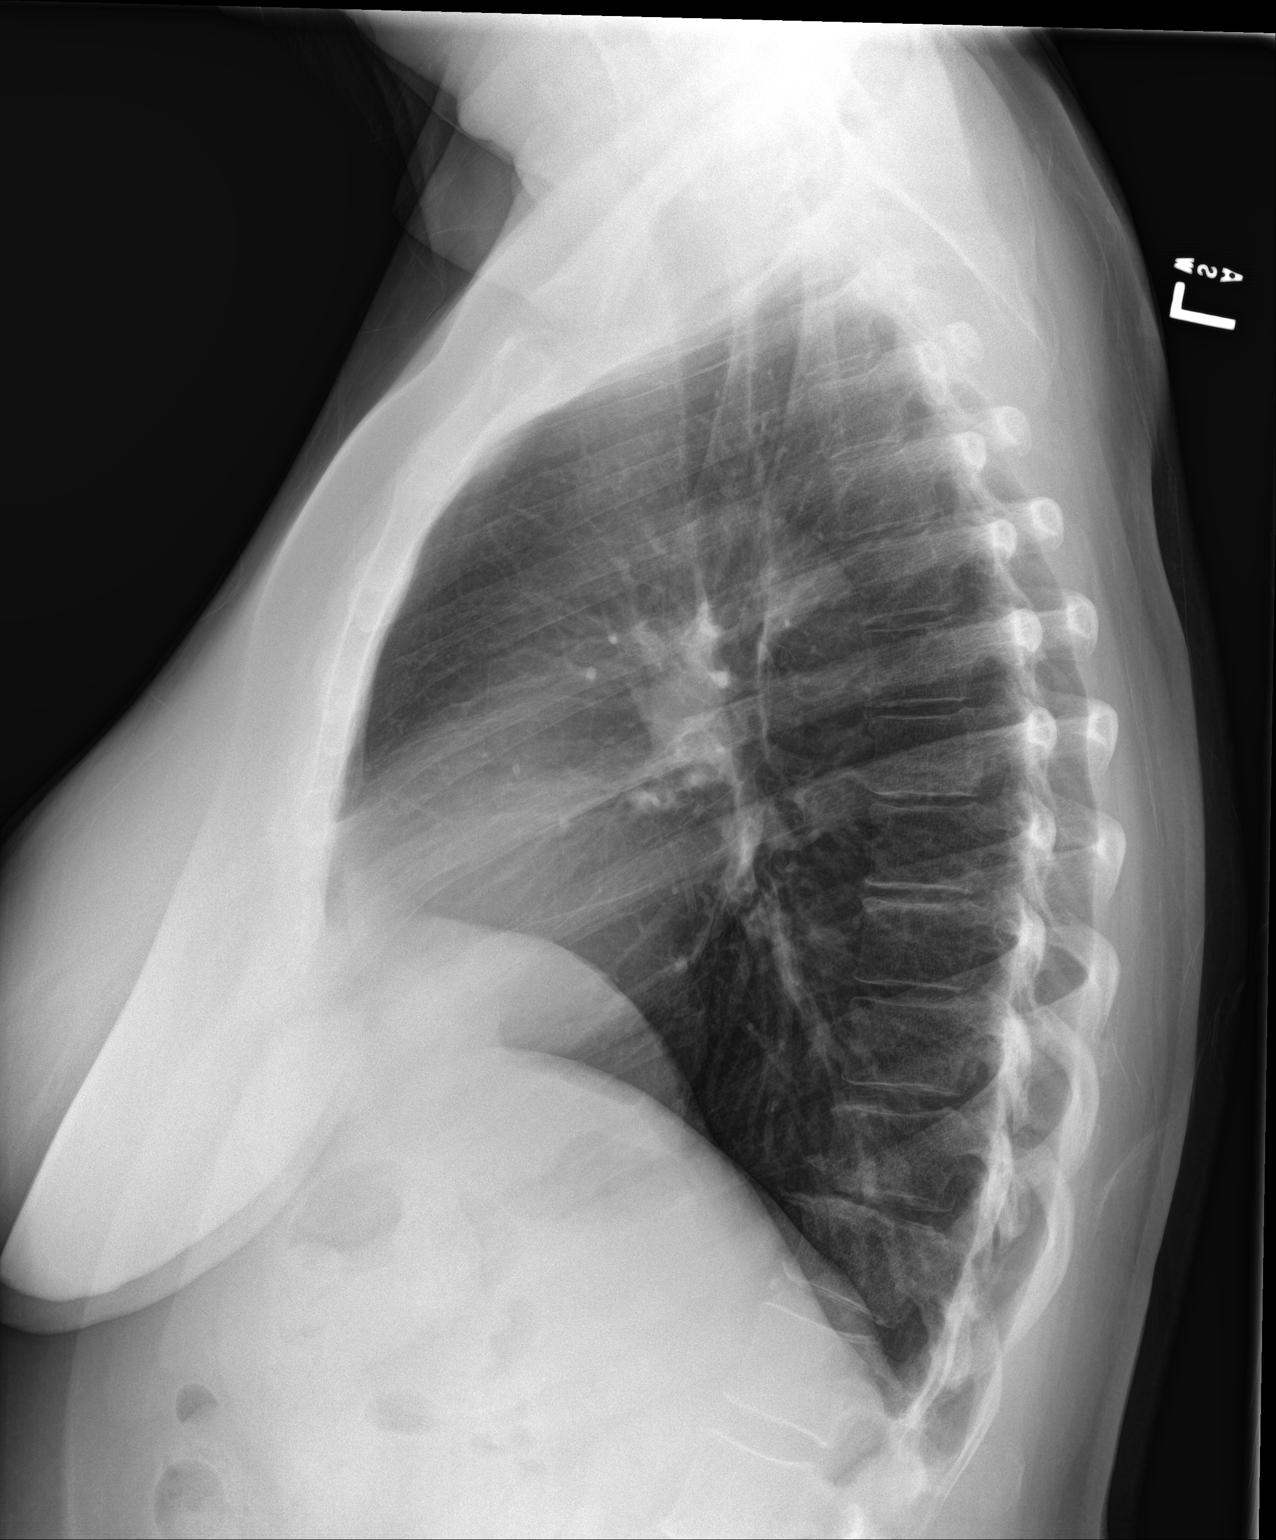

[1 of 1 positions shown; findings below may reference images not displayed]

FINDINGS: The heart size and mediastinal contours are within normal limits.
Atherosclerotic calcification of the aortic knob. Previously seen
bibasilar airspace opacities have resolved. No focal airspace
consolidation, pleural effusion, or pneumothorax. The visualized
skeletal structures are unremarkable.
IMPRESSION: No active cardiopulmonary disease.
